# Patient Record
Sex: Male | Born: 1940
Health system: Southern US, Community
[De-identification: ages and names within clinical notes are randomized; demographics above are authoritative.]

## PROBLEM LIST (undated history)

## (undated) DIAGNOSIS — I10 Essential (primary) hypertension: Secondary | ICD-10-CM

---

## 2012-01-05 DIAGNOSIS — R609 Edema, unspecified: Secondary | ICD-10-CM | POA: Diagnosis not present

## 2012-01-05 DIAGNOSIS — K219 Gastro-esophageal reflux disease without esophagitis: Secondary | ICD-10-CM | POA: Diagnosis not present

## 2012-01-05 DIAGNOSIS — K5900 Constipation, unspecified: Secondary | ICD-10-CM | POA: Diagnosis not present

## 2012-01-05 DIAGNOSIS — J301 Allergic rhinitis due to pollen: Secondary | ICD-10-CM | POA: Diagnosis not present

## 2012-01-06 DIAGNOSIS — M722 Plantar fascial fibromatosis: Secondary | ICD-10-CM | POA: Diagnosis not present

## 2012-01-06 DIAGNOSIS — M25579 Pain in unspecified ankle and joints of unspecified foot: Secondary | ICD-10-CM | POA: Diagnosis not present

## 2012-04-05 DIAGNOSIS — R55 Syncope and collapse: Secondary | ICD-10-CM | POA: Diagnosis not present

## 2012-04-05 DIAGNOSIS — E782 Mixed hyperlipidemia: Secondary | ICD-10-CM | POA: Diagnosis not present

## 2012-05-11 DIAGNOSIS — I10 Essential (primary) hypertension: Secondary | ICD-10-CM | POA: Diagnosis not present

## 2012-05-11 DIAGNOSIS — Z87891 Personal history of nicotine dependence: Secondary | ICD-10-CM | POA: Diagnosis not present

## 2012-05-11 DIAGNOSIS — R221 Localized swelling, mass and lump, neck: Secondary | ICD-10-CM | POA: Diagnosis not present

## 2012-05-11 DIAGNOSIS — R5383 Other fatigue: Secondary | ICD-10-CM | POA: Diagnosis not present

## 2012-05-11 DIAGNOSIS — K219 Gastro-esophageal reflux disease without esophagitis: Secondary | ICD-10-CM | POA: Diagnosis not present

## 2012-05-11 DIAGNOSIS — K449 Diaphragmatic hernia without obstruction or gangrene: Secondary | ICD-10-CM | POA: Diagnosis not present

## 2012-05-11 DIAGNOSIS — R0789 Other chest pain: Secondary | ICD-10-CM | POA: Diagnosis not present

## 2012-05-11 DIAGNOSIS — R220 Localized swelling, mass and lump, head: Secondary | ICD-10-CM | POA: Diagnosis not present

## 2012-05-11 DIAGNOSIS — R079 Chest pain, unspecified: Secondary | ICD-10-CM | POA: Diagnosis not present

## 2012-05-11 DIAGNOSIS — R0602 Shortness of breath: Secondary | ICD-10-CM | POA: Diagnosis not present

## 2012-05-11 DIAGNOSIS — J449 Chronic obstructive pulmonary disease, unspecified: Secondary | ICD-10-CM | POA: Diagnosis not present

## 2012-05-11 DIAGNOSIS — F329 Major depressive disorder, single episode, unspecified: Secondary | ICD-10-CM | POA: Diagnosis not present

## 2012-05-11 DIAGNOSIS — E785 Hyperlipidemia, unspecified: Secondary | ICD-10-CM | POA: Diagnosis not present

## 2012-05-11 DIAGNOSIS — E871 Hypo-osmolality and hyponatremia: Secondary | ICD-10-CM | POA: Diagnosis not present

## 2012-05-11 DIAGNOSIS — Z79899 Other long term (current) drug therapy: Secondary | ICD-10-CM | POA: Diagnosis not present

## 2012-05-11 DIAGNOSIS — Z8249 Family history of ischemic heart disease and other diseases of the circulatory system: Secondary | ICD-10-CM | POA: Diagnosis not present

## 2012-05-11 DIAGNOSIS — R42 Dizziness and giddiness: Secondary | ICD-10-CM | POA: Diagnosis not present

## 2012-05-12 DIAGNOSIS — I10 Essential (primary) hypertension: Secondary | ICD-10-CM | POA: Diagnosis not present

## 2012-05-12 DIAGNOSIS — E871 Hypo-osmolality and hyponatremia: Secondary | ICD-10-CM | POA: Diagnosis not present

## 2012-05-12 DIAGNOSIS — M47812 Spondylosis without myelopathy or radiculopathy, cervical region: Secondary | ICD-10-CM | POA: Diagnosis not present

## 2012-05-12 DIAGNOSIS — F329 Major depressive disorder, single episode, unspecified: Secondary | ICD-10-CM | POA: Diagnosis not present

## 2012-05-12 DIAGNOSIS — R079 Chest pain, unspecified: Secondary | ICD-10-CM | POA: Diagnosis not present

## 2012-05-12 DIAGNOSIS — R220 Localized swelling, mass and lump, head: Secondary | ICD-10-CM | POA: Diagnosis not present

## 2012-05-13 DIAGNOSIS — I10 Essential (primary) hypertension: Secondary | ICD-10-CM | POA: Diagnosis not present

## 2012-05-13 DIAGNOSIS — E871 Hypo-osmolality and hyponatremia: Secondary | ICD-10-CM | POA: Diagnosis not present

## 2012-05-13 DIAGNOSIS — F329 Major depressive disorder, single episode, unspecified: Secondary | ICD-10-CM | POA: Diagnosis not present

## 2012-05-13 DIAGNOSIS — R079 Chest pain, unspecified: Secondary | ICD-10-CM | POA: Diagnosis not present

## 2012-05-13 DIAGNOSIS — R221 Localized swelling, mass and lump, neck: Secondary | ICD-10-CM | POA: Diagnosis not present

## 2012-05-27 DIAGNOSIS — I209 Angina pectoris, unspecified: Secondary | ICD-10-CM | POA: Diagnosis not present

## 2012-07-05 DIAGNOSIS — J449 Chronic obstructive pulmonary disease, unspecified: Secondary | ICD-10-CM | POA: Diagnosis not present

## 2012-09-09 DIAGNOSIS — H2510 Age-related nuclear cataract, unspecified eye: Secondary | ICD-10-CM | POA: Diagnosis not present

## 2012-10-08 DIAGNOSIS — J209 Acute bronchitis, unspecified: Secondary | ICD-10-CM | POA: Diagnosis not present

## 2012-11-09 DIAGNOSIS — H25049 Posterior subcapsular polar age-related cataract, unspecified eye: Secondary | ICD-10-CM | POA: Diagnosis not present

## 2012-11-09 DIAGNOSIS — D3130 Benign neoplasm of unspecified choroid: Secondary | ICD-10-CM | POA: Diagnosis not present

## 2012-11-09 DIAGNOSIS — H2510 Age-related nuclear cataract, unspecified eye: Secondary | ICD-10-CM | POA: Diagnosis not present

## 2012-11-19 DIAGNOSIS — H2510 Age-related nuclear cataract, unspecified eye: Secondary | ICD-10-CM | POA: Diagnosis not present

## 2012-11-22 DIAGNOSIS — H2510 Age-related nuclear cataract, unspecified eye: Secondary | ICD-10-CM | POA: Diagnosis not present

## 2012-11-22 DIAGNOSIS — D3130 Benign neoplasm of unspecified choroid: Secondary | ICD-10-CM | POA: Diagnosis not present

## 2012-11-22 DIAGNOSIS — H25049 Posterior subcapsular polar age-related cataract, unspecified eye: Secondary | ICD-10-CM | POA: Diagnosis not present

## 2012-12-06 DIAGNOSIS — H2510 Age-related nuclear cataract, unspecified eye: Secondary | ICD-10-CM | POA: Diagnosis not present

## 2012-12-06 DIAGNOSIS — H25049 Posterior subcapsular polar age-related cataract, unspecified eye: Secondary | ICD-10-CM | POA: Diagnosis not present

## 2013-01-10 DIAGNOSIS — J449 Chronic obstructive pulmonary disease, unspecified: Secondary | ICD-10-CM | POA: Diagnosis not present

## 2013-04-11 DIAGNOSIS — Z0000 Encounter for general adult medical examination without abnormal findings: Secondary | ICD-10-CM | POA: Diagnosis not present

## 2013-04-11 DIAGNOSIS — R55 Syncope and collapse: Secondary | ICD-10-CM | POA: Diagnosis not present

## 2013-04-11 DIAGNOSIS — G3184 Mild cognitive impairment, so stated: Secondary | ICD-10-CM | POA: Diagnosis not present

## 2013-04-11 DIAGNOSIS — E782 Mixed hyperlipidemia: Secondary | ICD-10-CM | POA: Diagnosis not present

## 2013-04-11 DIAGNOSIS — Z125 Encounter for screening for malignant neoplasm of prostate: Secondary | ICD-10-CM | POA: Diagnosis not present

## 2013-04-11 DIAGNOSIS — E559 Vitamin D deficiency, unspecified: Secondary | ICD-10-CM | POA: Diagnosis not present

## 2013-05-05 DIAGNOSIS — R1310 Dysphagia, unspecified: Secondary | ICD-10-CM | POA: Diagnosis not present

## 2013-05-05 DIAGNOSIS — Z86010 Personal history of colonic polyps: Secondary | ICD-10-CM | POA: Diagnosis not present

## 2013-05-09 DIAGNOSIS — Z79899 Other long term (current) drug therapy: Secondary | ICD-10-CM | POA: Diagnosis not present

## 2013-05-09 DIAGNOSIS — Z1211 Encounter for screening for malignant neoplasm of colon: Secondary | ICD-10-CM | POA: Diagnosis not present

## 2013-05-09 DIAGNOSIS — J449 Chronic obstructive pulmonary disease, unspecified: Secondary | ICD-10-CM | POA: Diagnosis not present

## 2013-05-09 DIAGNOSIS — E785 Hyperlipidemia, unspecified: Secondary | ICD-10-CM | POA: Diagnosis not present

## 2013-05-09 DIAGNOSIS — Z818 Family history of other mental and behavioral disorders: Secondary | ICD-10-CM | POA: Diagnosis not present

## 2013-05-09 DIAGNOSIS — K5730 Diverticulosis of large intestine without perforation or abscess without bleeding: Secondary | ICD-10-CM | POA: Diagnosis not present

## 2013-05-09 DIAGNOSIS — K219 Gastro-esophageal reflux disease without esophagitis: Secondary | ICD-10-CM | POA: Diagnosis not present

## 2013-05-09 DIAGNOSIS — Z8701 Personal history of pneumonia (recurrent): Secondary | ICD-10-CM | POA: Diagnosis not present

## 2013-05-09 DIAGNOSIS — D131 Benign neoplasm of stomach: Secondary | ICD-10-CM | POA: Diagnosis not present

## 2013-05-09 DIAGNOSIS — Z860100 Personal history of colon polyps, unspecified: Secondary | ICD-10-CM | POA: Diagnosis not present

## 2013-05-09 DIAGNOSIS — D126 Benign neoplasm of colon, unspecified: Secondary | ICD-10-CM | POA: Diagnosis not present

## 2013-05-09 DIAGNOSIS — K222 Esophageal obstruction: Secondary | ICD-10-CM | POA: Diagnosis not present

## 2013-05-09 DIAGNOSIS — K5900 Constipation, unspecified: Secondary | ICD-10-CM | POA: Diagnosis not present

## 2013-05-09 DIAGNOSIS — Z87891 Personal history of nicotine dependence: Secondary | ICD-10-CM | POA: Diagnosis not present

## 2013-05-09 DIAGNOSIS — Z7982 Long term (current) use of aspirin: Secondary | ICD-10-CM | POA: Diagnosis not present

## 2013-05-09 DIAGNOSIS — R1310 Dysphagia, unspecified: Secondary | ICD-10-CM | POA: Diagnosis not present

## 2013-05-20 DIAGNOSIS — D126 Benign neoplasm of colon, unspecified: Secondary | ICD-10-CM | POA: Diagnosis not present

## 2013-05-20 DIAGNOSIS — R1310 Dysphagia, unspecified: Secondary | ICD-10-CM | POA: Diagnosis not present

## 2013-06-13 DIAGNOSIS — M899 Disorder of bone, unspecified: Secondary | ICD-10-CM | POA: Diagnosis not present

## 2013-06-13 DIAGNOSIS — J449 Chronic obstructive pulmonary disease, unspecified: Secondary | ICD-10-CM | POA: Diagnosis not present

## 2013-06-13 DIAGNOSIS — M949 Disorder of cartilage, unspecified: Secondary | ICD-10-CM | POA: Diagnosis not present

## 2013-06-13 DIAGNOSIS — Z87891 Personal history of nicotine dependence: Secondary | ICD-10-CM | POA: Diagnosis not present

## 2013-07-11 DIAGNOSIS — J449 Chronic obstructive pulmonary disease, unspecified: Secondary | ICD-10-CM | POA: Diagnosis not present

## 2013-08-01 DIAGNOSIS — R42 Dizziness and giddiness: Secondary | ICD-10-CM | POA: Diagnosis not present

## 2013-08-01 DIAGNOSIS — I69998 Other sequelae following unspecified cerebrovascular disease: Secondary | ICD-10-CM | POA: Diagnosis not present

## 2013-10-06 DIAGNOSIS — R0602 Shortness of breath: Secondary | ICD-10-CM | POA: Diagnosis not present

## 2013-10-06 DIAGNOSIS — Z825 Family history of asthma and other chronic lower respiratory diseases: Secondary | ICD-10-CM | POA: Diagnosis not present

## 2013-10-06 DIAGNOSIS — Z79899 Other long term (current) drug therapy: Secondary | ICD-10-CM | POA: Diagnosis not present

## 2013-10-06 DIAGNOSIS — J449 Chronic obstructive pulmonary disease, unspecified: Secondary | ICD-10-CM | POA: Diagnosis not present

## 2013-10-06 DIAGNOSIS — Z87891 Personal history of nicotine dependence: Secondary | ICD-10-CM | POA: Diagnosis not present

## 2013-10-06 DIAGNOSIS — R05 Cough: Secondary | ICD-10-CM | POA: Diagnosis not present

## 2013-10-06 DIAGNOSIS — Z836 Family history of other diseases of the respiratory system: Secondary | ICD-10-CM | POA: Diagnosis not present

## 2013-10-07 DIAGNOSIS — R0602 Shortness of breath: Secondary | ICD-10-CM | POA: Diagnosis not present

## 2013-10-11 DIAGNOSIS — Z2821 Immunization not carried out because of patient refusal: Secondary | ICD-10-CM | POA: Diagnosis not present

## 2013-10-11 DIAGNOSIS — Z79899 Other long term (current) drug therapy: Secondary | ICD-10-CM | POA: Diagnosis not present

## 2013-10-11 DIAGNOSIS — R55 Syncope and collapse: Secondary | ICD-10-CM | POA: Diagnosis not present

## 2013-10-11 DIAGNOSIS — I10 Essential (primary) hypertension: Secondary | ICD-10-CM | POA: Diagnosis not present

## 2013-10-11 DIAGNOSIS — I498 Other specified cardiac arrhythmias: Secondary | ICD-10-CM | POA: Diagnosis not present

## 2013-10-11 DIAGNOSIS — K219 Gastro-esophageal reflux disease without esophagitis: Secondary | ICD-10-CM | POA: Diagnosis not present

## 2013-10-11 DIAGNOSIS — Z87891 Personal history of nicotine dependence: Secondary | ICD-10-CM | POA: Diagnosis not present

## 2013-10-11 DIAGNOSIS — F329 Major depressive disorder, single episode, unspecified: Secondary | ICD-10-CM | POA: Diagnosis not present

## 2013-10-11 DIAGNOSIS — Z8249 Family history of ischemic heart disease and other diseases of the circulatory system: Secondary | ICD-10-CM | POA: Diagnosis not present

## 2013-10-11 DIAGNOSIS — Z836 Family history of other diseases of the respiratory system: Secondary | ICD-10-CM | POA: Diagnosis not present

## 2013-10-11 DIAGNOSIS — Z9861 Coronary angioplasty status: Secondary | ICD-10-CM | POA: Diagnosis not present

## 2013-10-11 DIAGNOSIS — E785 Hyperlipidemia, unspecified: Secondary | ICD-10-CM | POA: Diagnosis not present

## 2013-10-11 DIAGNOSIS — R42 Dizziness and giddiness: Secondary | ICD-10-CM | POA: Diagnosis not present

## 2013-10-11 DIAGNOSIS — I2510 Atherosclerotic heart disease of native coronary artery without angina pectoris: Secondary | ICD-10-CM | POA: Diagnosis not present

## 2013-10-11 DIAGNOSIS — I359 Nonrheumatic aortic valve disorder, unspecified: Secondary | ICD-10-CM | POA: Diagnosis not present

## 2013-10-12 DIAGNOSIS — I519 Heart disease, unspecified: Secondary | ICD-10-CM | POA: Diagnosis not present

## 2013-10-12 DIAGNOSIS — I70209 Unspecified atherosclerosis of native arteries of extremities, unspecified extremity: Secondary | ICD-10-CM | POA: Diagnosis not present

## 2013-10-12 DIAGNOSIS — I10 Essential (primary) hypertension: Secondary | ICD-10-CM | POA: Diagnosis not present

## 2013-10-12 DIAGNOSIS — I498 Other specified cardiac arrhythmias: Secondary | ICD-10-CM | POA: Diagnosis not present

## 2013-10-12 DIAGNOSIS — R55 Syncope and collapse: Secondary | ICD-10-CM | POA: Diagnosis not present

## 2013-10-12 DIAGNOSIS — R42 Dizziness and giddiness: Secondary | ICD-10-CM | POA: Diagnosis not present

## 2013-10-12 DIAGNOSIS — F329 Major depressive disorder, single episode, unspecified: Secondary | ICD-10-CM | POA: Diagnosis not present

## 2013-10-12 DIAGNOSIS — I359 Nonrheumatic aortic valve disorder, unspecified: Secondary | ICD-10-CM | POA: Diagnosis not present

## 2013-10-13 DIAGNOSIS — J4489 Other specified chronic obstructive pulmonary disease: Secondary | ICD-10-CM | POA: Diagnosis not present

## 2013-10-13 DIAGNOSIS — R42 Dizziness and giddiness: Secondary | ICD-10-CM | POA: Diagnosis not present

## 2013-10-13 DIAGNOSIS — F329 Major depressive disorder, single episode, unspecified: Secondary | ICD-10-CM | POA: Diagnosis not present

## 2013-10-13 DIAGNOSIS — F3289 Other specified depressive episodes: Secondary | ICD-10-CM | POA: Diagnosis not present

## 2013-10-13 DIAGNOSIS — R55 Syncope and collapse: Secondary | ICD-10-CM | POA: Diagnosis not present

## 2013-10-13 DIAGNOSIS — I359 Nonrheumatic aortic valve disorder, unspecified: Secondary | ICD-10-CM | POA: Diagnosis not present

## 2013-10-13 DIAGNOSIS — I498 Other specified cardiac arrhythmias: Secondary | ICD-10-CM | POA: Diagnosis not present

## 2013-10-13 DIAGNOSIS — I10 Essential (primary) hypertension: Secondary | ICD-10-CM | POA: Diagnosis not present

## 2013-10-14 DIAGNOSIS — I359 Nonrheumatic aortic valve disorder, unspecified: Secondary | ICD-10-CM | POA: Diagnosis not present

## 2013-10-14 DIAGNOSIS — I10 Essential (primary) hypertension: Secondary | ICD-10-CM | POA: Diagnosis not present

## 2013-10-14 DIAGNOSIS — I498 Other specified cardiac arrhythmias: Secondary | ICD-10-CM | POA: Diagnosis not present

## 2013-10-14 DIAGNOSIS — R55 Syncope and collapse: Secondary | ICD-10-CM | POA: Diagnosis not present

## 2013-10-14 DIAGNOSIS — R42 Dizziness and giddiness: Secondary | ICD-10-CM | POA: Diagnosis not present

## 2013-10-14 DIAGNOSIS — F329 Major depressive disorder, single episode, unspecified: Secondary | ICD-10-CM | POA: Diagnosis not present

## 2013-11-08 DIAGNOSIS — G3184 Mild cognitive impairment, so stated: Secondary | ICD-10-CM | POA: Diagnosis not present

## 2014-02-07 DIAGNOSIS — J449 Chronic obstructive pulmonary disease, unspecified: Secondary | ICD-10-CM | POA: Diagnosis not present

## 2014-02-07 DIAGNOSIS — Z79899 Other long term (current) drug therapy: Secondary | ICD-10-CM | POA: Diagnosis not present

## 2014-02-07 DIAGNOSIS — R319 Hematuria, unspecified: Secondary | ICD-10-CM | POA: Diagnosis not present

## 2014-02-07 DIAGNOSIS — Z136 Encounter for screening for cardiovascular disorders: Secondary | ICD-10-CM | POA: Diagnosis not present

## 2014-04-10 DIAGNOSIS — Z961 Presence of intraocular lens: Secondary | ICD-10-CM | POA: Diagnosis not present

## 2014-04-10 DIAGNOSIS — H43819 Vitreous degeneration, unspecified eye: Secondary | ICD-10-CM | POA: Diagnosis not present

## 2014-04-24 DIAGNOSIS — H43399 Other vitreous opacities, unspecified eye: Secondary | ICD-10-CM | POA: Diagnosis not present

## 2014-05-10 DIAGNOSIS — J449 Chronic obstructive pulmonary disease, unspecified: Secondary | ICD-10-CM | POA: Diagnosis not present

## 2014-05-24 DIAGNOSIS — H43819 Vitreous degeneration, unspecified eye: Secondary | ICD-10-CM | POA: Diagnosis not present

## 2014-08-17 DIAGNOSIS — J449 Chronic obstructive pulmonary disease, unspecified: Secondary | ICD-10-CM | POA: Diagnosis not present

## 2014-08-25 DIAGNOSIS — J449 Chronic obstructive pulmonary disease, unspecified: Secondary | ICD-10-CM | POA: Diagnosis not present

## 2014-11-21 DIAGNOSIS — Z136 Encounter for screening for cardiovascular disorders: Secondary | ICD-10-CM | POA: Diagnosis not present

## 2014-11-21 DIAGNOSIS — J441 Chronic obstructive pulmonary disease with (acute) exacerbation: Secondary | ICD-10-CM | POA: Diagnosis not present

## 2015-02-23 DIAGNOSIS — Z0000 Encounter for general adult medical examination without abnormal findings: Secondary | ICD-10-CM | POA: Diagnosis not present

## 2015-02-23 DIAGNOSIS — Z136 Encounter for screening for cardiovascular disorders: Secondary | ICD-10-CM | POA: Diagnosis not present

## 2015-02-23 DIAGNOSIS — J441 Chronic obstructive pulmonary disease with (acute) exacerbation: Secondary | ICD-10-CM | POA: Diagnosis not present

## 2015-02-23 DIAGNOSIS — Z125 Encounter for screening for malignant neoplasm of prostate: Secondary | ICD-10-CM | POA: Diagnosis not present

## 2015-02-23 DIAGNOSIS — Z23 Encounter for immunization: Secondary | ICD-10-CM | POA: Diagnosis not present

## 2015-02-23 DIAGNOSIS — Z131 Encounter for screening for diabetes mellitus: Secondary | ICD-10-CM | POA: Diagnosis not present

## 2015-02-23 DIAGNOSIS — Z1389 Encounter for screening for other disorder: Secondary | ICD-10-CM | POA: Diagnosis not present

## 2015-05-16 DIAGNOSIS — F0390 Unspecified dementia without behavioral disturbance: Secondary | ICD-10-CM | POA: Diagnosis not present

## 2015-05-16 DIAGNOSIS — J441 Chronic obstructive pulmonary disease with (acute) exacerbation: Secondary | ICD-10-CM | POA: Diagnosis not present

## 2015-05-16 DIAGNOSIS — J309 Allergic rhinitis, unspecified: Secondary | ICD-10-CM | POA: Diagnosis not present

## 2015-05-16 DIAGNOSIS — I2510 Atherosclerotic heart disease of native coronary artery without angina pectoris: Secondary | ICD-10-CM | POA: Diagnosis not present

## 2015-07-22 DIAGNOSIS — Z955 Presence of coronary angioplasty implant and graft: Secondary | ICD-10-CM | POA: Diagnosis not present

## 2015-07-22 DIAGNOSIS — K219 Gastro-esophageal reflux disease without esophagitis: Secondary | ICD-10-CM | POA: Diagnosis not present

## 2015-07-22 DIAGNOSIS — I2510 Atherosclerotic heart disease of native coronary artery without angina pectoris: Secondary | ICD-10-CM | POA: Diagnosis not present

## 2015-07-22 DIAGNOSIS — J45909 Unspecified asthma, uncomplicated: Secondary | ICD-10-CM | POA: Diagnosis not present

## 2015-07-22 DIAGNOSIS — Z7951 Long term (current) use of inhaled steroids: Secondary | ICD-10-CM | POA: Diagnosis not present

## 2015-07-22 DIAGNOSIS — Z7982 Long term (current) use of aspirin: Secondary | ICD-10-CM | POA: Diagnosis not present

## 2015-07-22 DIAGNOSIS — Z79899 Other long term (current) drug therapy: Secondary | ICD-10-CM | POA: Diagnosis not present

## 2015-07-22 DIAGNOSIS — I10 Essential (primary) hypertension: Secondary | ICD-10-CM | POA: Diagnosis not present

## 2015-07-22 DIAGNOSIS — R079 Chest pain, unspecified: Secondary | ICD-10-CM | POA: Diagnosis not present

## 2015-07-22 DIAGNOSIS — R42 Dizziness and giddiness: Secondary | ICD-10-CM | POA: Diagnosis not present

## 2015-07-22 DIAGNOSIS — F329 Major depressive disorder, single episode, unspecified: Secondary | ICD-10-CM | POA: Diagnosis not present

## 2015-07-22 DIAGNOSIS — R0602 Shortness of breath: Secondary | ICD-10-CM | POA: Diagnosis not present

## 2015-07-23 DIAGNOSIS — I10 Essential (primary) hypertension: Secondary | ICD-10-CM | POA: Diagnosis not present

## 2015-07-23 DIAGNOSIS — R079 Chest pain, unspecified: Secondary | ICD-10-CM | POA: Diagnosis not present

## 2015-07-23 DIAGNOSIS — I2510 Atherosclerotic heart disease of native coronary artery without angina pectoris: Secondary | ICD-10-CM | POA: Diagnosis not present

## 2015-07-23 DIAGNOSIS — F329 Major depressive disorder, single episode, unspecified: Secondary | ICD-10-CM | POA: Diagnosis not present

## 2015-07-23 DIAGNOSIS — Z955 Presence of coronary angioplasty implant and graft: Secondary | ICD-10-CM | POA: Diagnosis not present

## 2015-07-26 DIAGNOSIS — R079 Chest pain, unspecified: Secondary | ICD-10-CM | POA: Diagnosis not present

## 2015-07-30 DIAGNOSIS — I2510 Atherosclerotic heart disease of native coronary artery without angina pectoris: Secondary | ICD-10-CM | POA: Diagnosis not present

## 2015-07-30 DIAGNOSIS — E559 Vitamin D deficiency, unspecified: Secondary | ICD-10-CM | POA: Diagnosis not present

## 2015-07-30 DIAGNOSIS — F0390 Unspecified dementia without behavioral disturbance: Secondary | ICD-10-CM | POA: Diagnosis not present

## 2015-10-30 DIAGNOSIS — I2510 Atherosclerotic heart disease of native coronary artery without angina pectoris: Secondary | ICD-10-CM | POA: Diagnosis not present

## 2015-10-30 DIAGNOSIS — J440 Chronic obstructive pulmonary disease with acute lower respiratory infection: Secondary | ICD-10-CM | POA: Diagnosis not present

## 2015-10-30 DIAGNOSIS — F0280 Dementia in other diseases classified elsewhere without behavioral disturbance: Secondary | ICD-10-CM | POA: Diagnosis not present

## 2016-03-04 DIAGNOSIS — J440 Chronic obstructive pulmonary disease with acute lower respiratory infection: Secondary | ICD-10-CM | POA: Diagnosis not present

## 2016-03-04 DIAGNOSIS — K210 Gastro-esophageal reflux disease with esophagitis: Secondary | ICD-10-CM | POA: Diagnosis not present

## 2016-03-04 DIAGNOSIS — F0280 Dementia in other diseases classified elsewhere without behavioral disturbance: Secondary | ICD-10-CM | POA: Diagnosis not present

## 2016-04-24 DIAGNOSIS — K210 Gastro-esophageal reflux disease with esophagitis: Secondary | ICD-10-CM | POA: Diagnosis not present

## 2016-04-24 DIAGNOSIS — R5383 Other fatigue: Secondary | ICD-10-CM | POA: Diagnosis not present

## 2016-04-24 DIAGNOSIS — F0280 Dementia in other diseases classified elsewhere without behavioral disturbance: Secondary | ICD-10-CM | POA: Diagnosis not present

## 2016-04-28 DIAGNOSIS — H8113 Benign paroxysmal vertigo, bilateral: Secondary | ICD-10-CM | POA: Diagnosis not present

## 2016-04-28 DIAGNOSIS — H918X3 Other specified hearing loss, bilateral: Secondary | ICD-10-CM | POA: Diagnosis not present

## 2016-04-28 DIAGNOSIS — H9190 Unspecified hearing loss, unspecified ear: Secondary | ICD-10-CM | POA: Diagnosis not present

## 2016-04-28 DIAGNOSIS — H8110 Benign paroxysmal vertigo, unspecified ear: Secondary | ICD-10-CM | POA: Diagnosis not present

## 2016-04-28 DIAGNOSIS — K219 Gastro-esophageal reflux disease without esophagitis: Secondary | ICD-10-CM | POA: Diagnosis not present

## 2016-04-28 DIAGNOSIS — Z9181 History of falling: Secondary | ICD-10-CM | POA: Diagnosis not present

## 2016-04-28 DIAGNOSIS — F0280 Dementia in other diseases classified elsewhere without behavioral disturbance: Secondary | ICD-10-CM | POA: Diagnosis not present

## 2016-04-30 DIAGNOSIS — H9190 Unspecified hearing loss, unspecified ear: Secondary | ICD-10-CM | POA: Diagnosis not present

## 2016-04-30 DIAGNOSIS — H8110 Benign paroxysmal vertigo, unspecified ear: Secondary | ICD-10-CM | POA: Diagnosis not present

## 2016-04-30 DIAGNOSIS — F0280 Dementia in other diseases classified elsewhere without behavioral disturbance: Secondary | ICD-10-CM | POA: Diagnosis not present

## 2016-04-30 DIAGNOSIS — K219 Gastro-esophageal reflux disease without esophagitis: Secondary | ICD-10-CM | POA: Diagnosis not present

## 2016-04-30 DIAGNOSIS — Z9181 History of falling: Secondary | ICD-10-CM | POA: Diagnosis not present

## 2016-05-01 DIAGNOSIS — Z9181 History of falling: Secondary | ICD-10-CM | POA: Diagnosis not present

## 2016-05-01 DIAGNOSIS — M1611 Unilateral primary osteoarthritis, right hip: Secondary | ICD-10-CM | POA: Diagnosis not present

## 2016-05-01 DIAGNOSIS — F0280 Dementia in other diseases classified elsewhere without behavioral disturbance: Secondary | ICD-10-CM | POA: Diagnosis not present

## 2016-05-01 DIAGNOSIS — K219 Gastro-esophageal reflux disease without esophagitis: Secondary | ICD-10-CM | POA: Diagnosis not present

## 2016-05-01 DIAGNOSIS — H8110 Benign paroxysmal vertigo, unspecified ear: Secondary | ICD-10-CM | POA: Diagnosis not present

## 2016-05-01 DIAGNOSIS — H9190 Unspecified hearing loss, unspecified ear: Secondary | ICD-10-CM | POA: Diagnosis not present

## 2016-05-01 DIAGNOSIS — M25551 Pain in right hip: Secondary | ICD-10-CM | POA: Diagnosis not present

## 2016-05-06 DIAGNOSIS — F0280 Dementia in other diseases classified elsewhere without behavioral disturbance: Secondary | ICD-10-CM | POA: Diagnosis not present

## 2016-05-06 DIAGNOSIS — Z9181 History of falling: Secondary | ICD-10-CM | POA: Diagnosis not present

## 2016-05-06 DIAGNOSIS — K219 Gastro-esophageal reflux disease without esophagitis: Secondary | ICD-10-CM | POA: Diagnosis not present

## 2016-05-06 DIAGNOSIS — H9190 Unspecified hearing loss, unspecified ear: Secondary | ICD-10-CM | POA: Diagnosis not present

## 2016-05-06 DIAGNOSIS — H8110 Benign paroxysmal vertigo, unspecified ear: Secondary | ICD-10-CM | POA: Diagnosis not present

## 2016-05-07 DIAGNOSIS — M5126 Other intervertebral disc displacement, lumbar region: Secondary | ICD-10-CM | POA: Diagnosis not present

## 2016-05-07 DIAGNOSIS — M47816 Spondylosis without myelopathy or radiculopathy, lumbar region: Secondary | ICD-10-CM | POA: Diagnosis not present

## 2016-05-08 DIAGNOSIS — H9190 Unspecified hearing loss, unspecified ear: Secondary | ICD-10-CM | POA: Diagnosis not present

## 2016-05-08 DIAGNOSIS — K219 Gastro-esophageal reflux disease without esophagitis: Secondary | ICD-10-CM | POA: Diagnosis not present

## 2016-05-08 DIAGNOSIS — Z9181 History of falling: Secondary | ICD-10-CM | POA: Diagnosis not present

## 2016-05-08 DIAGNOSIS — H8110 Benign paroxysmal vertigo, unspecified ear: Secondary | ICD-10-CM | POA: Diagnosis not present

## 2016-05-08 DIAGNOSIS — F0280 Dementia in other diseases classified elsewhere without behavioral disturbance: Secondary | ICD-10-CM | POA: Diagnosis not present

## 2016-05-13 DIAGNOSIS — H9190 Unspecified hearing loss, unspecified ear: Secondary | ICD-10-CM | POA: Diagnosis not present

## 2016-05-13 DIAGNOSIS — K219 Gastro-esophageal reflux disease without esophagitis: Secondary | ICD-10-CM | POA: Diagnosis not present

## 2016-05-13 DIAGNOSIS — Z9181 History of falling: Secondary | ICD-10-CM | POA: Diagnosis not present

## 2016-05-13 DIAGNOSIS — F0280 Dementia in other diseases classified elsewhere without behavioral disturbance: Secondary | ICD-10-CM | POA: Diagnosis not present

## 2016-05-13 DIAGNOSIS — H8110 Benign paroxysmal vertigo, unspecified ear: Secondary | ICD-10-CM | POA: Diagnosis not present

## 2016-05-14 DIAGNOSIS — K219 Gastro-esophageal reflux disease without esophagitis: Secondary | ICD-10-CM | POA: Diagnosis not present

## 2016-05-14 DIAGNOSIS — H8110 Benign paroxysmal vertigo, unspecified ear: Secondary | ICD-10-CM | POA: Diagnosis not present

## 2016-05-14 DIAGNOSIS — H9190 Unspecified hearing loss, unspecified ear: Secondary | ICD-10-CM | POA: Diagnosis not present

## 2016-05-14 DIAGNOSIS — F0280 Dementia in other diseases classified elsewhere without behavioral disturbance: Secondary | ICD-10-CM | POA: Diagnosis not present

## 2016-05-14 DIAGNOSIS — Z9181 History of falling: Secondary | ICD-10-CM | POA: Diagnosis not present

## 2016-05-16 DIAGNOSIS — K219 Gastro-esophageal reflux disease without esophagitis: Secondary | ICD-10-CM | POA: Diagnosis not present

## 2016-05-16 DIAGNOSIS — H8110 Benign paroxysmal vertigo, unspecified ear: Secondary | ICD-10-CM | POA: Diagnosis not present

## 2016-05-16 DIAGNOSIS — H9190 Unspecified hearing loss, unspecified ear: Secondary | ICD-10-CM | POA: Diagnosis not present

## 2016-05-16 DIAGNOSIS — Z9181 History of falling: Secondary | ICD-10-CM | POA: Diagnosis not present

## 2016-05-16 DIAGNOSIS — F0280 Dementia in other diseases classified elsewhere without behavioral disturbance: Secondary | ICD-10-CM | POA: Diagnosis not present

## 2016-05-19 DIAGNOSIS — K219 Gastro-esophageal reflux disease without esophagitis: Secondary | ICD-10-CM | POA: Diagnosis not present

## 2016-05-19 DIAGNOSIS — Z9181 History of falling: Secondary | ICD-10-CM | POA: Diagnosis not present

## 2016-05-19 DIAGNOSIS — H9190 Unspecified hearing loss, unspecified ear: Secondary | ICD-10-CM | POA: Diagnosis not present

## 2016-05-19 DIAGNOSIS — H8110 Benign paroxysmal vertigo, unspecified ear: Secondary | ICD-10-CM | POA: Diagnosis not present

## 2016-05-19 DIAGNOSIS — F0280 Dementia in other diseases classified elsewhere without behavioral disturbance: Secondary | ICD-10-CM | POA: Diagnosis not present

## 2016-05-22 DIAGNOSIS — F0280 Dementia in other diseases classified elsewhere without behavioral disturbance: Secondary | ICD-10-CM | POA: Diagnosis not present

## 2016-05-22 DIAGNOSIS — Z9181 History of falling: Secondary | ICD-10-CM | POA: Diagnosis not present

## 2016-05-22 DIAGNOSIS — K219 Gastro-esophageal reflux disease without esophagitis: Secondary | ICD-10-CM | POA: Diagnosis not present

## 2016-05-22 DIAGNOSIS — H8110 Benign paroxysmal vertigo, unspecified ear: Secondary | ICD-10-CM | POA: Diagnosis not present

## 2016-05-22 DIAGNOSIS — H9190 Unspecified hearing loss, unspecified ear: Secondary | ICD-10-CM | POA: Diagnosis not present

## 2016-05-27 DIAGNOSIS — H9190 Unspecified hearing loss, unspecified ear: Secondary | ICD-10-CM | POA: Diagnosis not present

## 2016-05-27 DIAGNOSIS — K210 Gastro-esophageal reflux disease with esophagitis: Secondary | ICD-10-CM | POA: Diagnosis not present

## 2016-05-27 DIAGNOSIS — H61892 Other specified disorders of left external ear: Secondary | ICD-10-CM | POA: Diagnosis not present

## 2016-05-27 DIAGNOSIS — Z9181 History of falling: Secondary | ICD-10-CM | POA: Diagnosis not present

## 2016-05-27 DIAGNOSIS — F0280 Dementia in other diseases classified elsewhere without behavioral disturbance: Secondary | ICD-10-CM | POA: Diagnosis not present

## 2016-05-27 DIAGNOSIS — H8110 Benign paroxysmal vertigo, unspecified ear: Secondary | ICD-10-CM | POA: Diagnosis not present

## 2016-05-27 DIAGNOSIS — J440 Chronic obstructive pulmonary disease with acute lower respiratory infection: Secondary | ICD-10-CM | POA: Diagnosis not present

## 2016-05-27 DIAGNOSIS — K219 Gastro-esophageal reflux disease without esophagitis: Secondary | ICD-10-CM | POA: Diagnosis not present

## 2016-08-11 DIAGNOSIS — H8113 Benign paroxysmal vertigo, bilateral: Secondary | ICD-10-CM | POA: Diagnosis not present

## 2016-08-11 DIAGNOSIS — Z13220 Encounter for screening for lipoid disorders: Secondary | ICD-10-CM | POA: Diagnosis not present

## 2016-08-11 DIAGNOSIS — Z0000 Encounter for general adult medical examination without abnormal findings: Secondary | ICD-10-CM | POA: Diagnosis not present

## 2016-08-11 DIAGNOSIS — Z131 Encounter for screening for diabetes mellitus: Secondary | ICD-10-CM | POA: Diagnosis not present

## 2016-08-11 DIAGNOSIS — J440 Chronic obstructive pulmonary disease with acute lower respiratory infection: Secondary | ICD-10-CM | POA: Diagnosis not present

## 2016-08-11 DIAGNOSIS — K210 Gastro-esophageal reflux disease with esophagitis: Secondary | ICD-10-CM | POA: Diagnosis not present

## 2016-08-11 DIAGNOSIS — Z1389 Encounter for screening for other disorder: Secondary | ICD-10-CM | POA: Diagnosis not present

## 2016-11-11 DIAGNOSIS — J440 Chronic obstructive pulmonary disease with acute lower respiratory infection: Secondary | ICD-10-CM | POA: Diagnosis not present

## 2016-11-11 DIAGNOSIS — I208 Other forms of angina pectoris: Secondary | ICD-10-CM | POA: Diagnosis not present

## 2016-11-11 DIAGNOSIS — K210 Gastro-esophageal reflux disease with esophagitis: Secondary | ICD-10-CM | POA: Diagnosis not present

## 2016-11-11 DIAGNOSIS — Z1329 Encounter for screening for other suspected endocrine disorder: Secondary | ICD-10-CM | POA: Diagnosis not present

## 2016-12-26 ENCOUNTER — Inpatient Hospital Stay (HOSPITAL_COMMUNITY)
Admission: EM | Admit: 2016-12-26 | Discharge: 2016-12-30 | DRG: 640 | Disposition: A | Discharge status: Home Health | Payer: Medicare Other | Attending: Internal Medicine | Admitting: Internal Medicine

## 2016-12-26 ENCOUNTER — Emergency Department (HOSPITAL_COMMUNITY): Payer: Medicare Other

## 2016-12-26 ENCOUNTER — Encounter (HOSPITAL_COMMUNITY): Payer: Self-pay | Admitting: Emergency Medicine

## 2016-12-26 DIAGNOSIS — G9340 Encephalopathy, unspecified: Secondary | ICD-10-CM | POA: Diagnosis present

## 2016-12-26 DIAGNOSIS — R410 Disorientation, unspecified: Secondary | ICD-10-CM

## 2016-12-26 DIAGNOSIS — F0390 Unspecified dementia without behavioral disturbance: Secondary | ICD-10-CM | POA: Diagnosis not present

## 2016-12-26 DIAGNOSIS — R42 Dizziness and giddiness: Secondary | ICD-10-CM | POA: Diagnosis not present

## 2016-12-26 DIAGNOSIS — I951 Orthostatic hypotension: Secondary | ICD-10-CM | POA: Diagnosis present

## 2016-12-26 DIAGNOSIS — E860 Dehydration: Secondary | ICD-10-CM | POA: Diagnosis not present

## 2016-12-26 DIAGNOSIS — R2681 Unsteadiness on feet: Secondary | ICD-10-CM

## 2016-12-26 DIAGNOSIS — Z79899 Other long term (current) drug therapy: Secondary | ICD-10-CM

## 2016-12-26 DIAGNOSIS — E871 Hypo-osmolality and hyponatremia: Secondary | ICD-10-CM | POA: Diagnosis present

## 2016-12-26 DIAGNOSIS — R262 Difficulty in walking, not elsewhere classified: Secondary | ICD-10-CM

## 2016-12-26 DIAGNOSIS — G9349 Other encephalopathy: Secondary | ICD-10-CM | POA: Diagnosis not present

## 2016-12-26 DIAGNOSIS — I350 Nonrheumatic aortic (valve) stenosis: Secondary | ICD-10-CM | POA: Diagnosis present

## 2016-12-26 DIAGNOSIS — R4701 Aphasia: Secondary | ICD-10-CM | POA: Diagnosis present

## 2016-12-26 DIAGNOSIS — R001 Bradycardia, unspecified: Secondary | ICD-10-CM | POA: Diagnosis present

## 2016-12-26 DIAGNOSIS — R471 Dysarthria and anarthria: Secondary | ICD-10-CM | POA: Diagnosis not present

## 2016-12-26 DIAGNOSIS — G309 Alzheimer's disease, unspecified: Secondary | ICD-10-CM | POA: Diagnosis not present

## 2016-12-26 DIAGNOSIS — F0280 Dementia in other diseases classified elsewhere without behavioral disturbance: Secondary | ICD-10-CM | POA: Diagnosis not present

## 2016-12-26 DIAGNOSIS — L219 Seborrheic dermatitis, unspecified: Secondary | ICD-10-CM | POA: Insufficient documentation

## 2016-12-26 DIAGNOSIS — Z7951 Long term (current) use of inhaled steroids: Secondary | ICD-10-CM

## 2016-12-26 DIAGNOSIS — I10 Essential (primary) hypertension: Secondary | ICD-10-CM | POA: Diagnosis present

## 2016-12-26 DIAGNOSIS — E785 Hyperlipidemia, unspecified: Secondary | ICD-10-CM | POA: Diagnosis present

## 2016-12-26 DIAGNOSIS — Z87891 Personal history of nicotine dependence: Secondary | ICD-10-CM

## 2016-12-26 HISTORY — DX: Essential (primary) hypertension: I10

## 2016-12-26 LAB — COMPREHENSIVE METABOLIC PANEL
ALBUMIN: 4.4 g/dL (ref 3.5–5.0)
ALK PHOS: 71 U/L (ref 38–126)
ALT: 14 U/L — AB (ref 17–63)
AST: 24 U/L (ref 15–41)
Anion gap: 6 (ref 5–15)
BILIRUBIN TOTAL: 1.3 mg/dL — AB (ref 0.3–1.2)
BUN: 23 mg/dL — AB (ref 6–20)
CALCIUM: 9.2 mg/dL (ref 8.9–10.3)
CO2: 27 mmol/L (ref 22–32)
Chloride: 101 mmol/L (ref 101–111)
Creatinine, Ser: 1.03 mg/dL (ref 0.61–1.24)
GFR calc Af Amer: >60 mL/min (ref >60)
GFR calc non Af Amer: >60 mL/min (ref >60)
GLUCOSE: 83 mg/dL (ref 65–99)
Potassium: 4 mmol/L (ref 3.5–5.1)
Sodium: 134 mmol/L — ABNORMAL LOW (ref 135–145)
TOTAL PROTEIN: 7.4 g/dL (ref 6.5–8.1)

## 2016-12-26 LAB — PROTIME-INR
INR: 0.99
Prothrombin Time: 13.1 seconds (ref 11.4–15.2)

## 2016-12-26 LAB — TROPONIN I

## 2016-12-26 LAB — CBC
HCT: 46.9 % (ref 39.0–52.0)
HEMOGLOBIN: 16 g/dL (ref 13.0–17.0)
MCH: 33.7 pg (ref 26.0–34.0)
MCHC: 34.1 g/dL (ref 30.0–36.0)
MCV: 98.7 fL (ref 78.0–100.0)
Platelets: 198 10*3/uL (ref 150–400)
RBC: 4.75 MIL/uL (ref 4.22–5.81)
RDW: 13.1 % (ref 11.5–15.5)
WBC: 7.1 10*3/uL (ref 4.0–10.5)

## 2016-12-26 LAB — DIFFERENTIAL
BASOS ABS: 0 10*3/uL (ref 0.0–0.1)
Basophils Relative: 0 %
Eosinophils Absolute: 0.1 10*3/uL (ref 0.0–0.7)
Eosinophils Relative: 2 %
LYMPHS ABS: 1.5 10*3/uL (ref 0.7–4.0)
LYMPHS PCT: 21 %
Monocytes Absolute: 0.9 10*3/uL (ref 0.1–1.0)
Monocytes Relative: 13 %
NEUTROS ABS: 4.6 10*3/uL (ref 1.7–7.7)
NEUTROS PCT: 64 %

## 2016-12-26 LAB — ETHANOL: Alcohol, Ethyl (B): <5 mg/dL (ref <5)

## 2016-12-26 LAB — URINALYSIS, ROUTINE W REFLEX MICROSCOPIC
BILIRUBIN URINE: NEGATIVE
Glucose, UA: NEGATIVE mg/dL
Hgb urine dipstick: NEGATIVE
Ketones, ur: 20 mg/dL — AB
Leukocytes, UA: NEGATIVE
NITRITE: NEGATIVE
PROTEIN: NEGATIVE mg/dL
SPECIFIC GRAVITY, URINE: 1.023 (ref 1.005–1.030)
pH: 5 (ref 5.0–8.0)

## 2016-12-26 LAB — APTT: APTT: 26 s (ref 24–36)

## 2016-12-26 LAB — TSH: TSH: 1.673 u[IU]/mL (ref 0.350–4.500)

## 2016-12-26 LAB — RAPID URINE DRUG SCREEN, HOSP PERFORMED
AMPHETAMINES: NOT DETECTED
BARBITURATES: NOT DETECTED
Benzodiazepines: NOT DETECTED
Cocaine: NOT DETECTED
Opiates: NOT DETECTED
Tetrahydrocannabinol: NOT DETECTED

## 2016-12-26 LAB — AMMONIA: Ammonia: 16 umol/L (ref 9–35)

## 2016-12-26 MED ORDER — MOMETASONE FURO-FORMOTEROL FUM 200-5 MCG/ACT IN AERO
2.0000 | INHALATION_SPRAY | Freq: Two times a day (BID) | RESPIRATORY_TRACT | Status: DC
  Administered 2016-12-26 – 2016-12-30 (×8): 2 via RESPIRATORY_TRACT
  Filled 2016-12-26: qty 8.8

## 2016-12-26 MED ORDER — MOMETASONE FURO-FORMOTEROL FUM 200-5 MCG/ACT IN AERO
INHALATION_SPRAY | RESPIRATORY_TRACT | Status: AC
  Filled 2016-12-26: qty 8.8

## 2016-12-26 MED ORDER — SENNOSIDES-DOCUSATE SODIUM 8.6-50 MG PO TABS
1.0000 | ORAL_TABLET | Freq: Every evening | ORAL | Status: DC | PRN
  Filled 2016-12-26: qty 1

## 2016-12-26 MED ORDER — ACETAMINOPHEN 650 MG RE SUPP: 650.0000 mg | RECTAL | Status: DC | PRN

## 2016-12-26 MED ORDER — ALBUTEROL SULFATE (2.5 MG/3ML) 0.083% IN NEBU: 3.0000 mL | INHALATION_SOLUTION | Freq: Four times a day (QID) | RESPIRATORY_TRACT | Status: DC | PRN

## 2016-12-26 MED ORDER — ACETAMINOPHEN 325 MG PO TABS: 650.0000 mg | ORAL_TABLET | ORAL | Status: DC | PRN

## 2016-12-26 MED ORDER — FLUTICASONE PROPIONATE 50 MCG/ACT NA SUSP
1.0000 | Freq: Every day | NASAL | Status: DC
Start: 2016-12-27 — End: 2016-12-30
  Administered 2016-12-27 – 2016-12-30 (×4): 1 via NASAL
  Filled 2016-12-26 (×2): qty 16

## 2016-12-26 MED ORDER — MEMANTINE HCL-DONEPEZIL HCL ER 28-10 MG PO CP24
1.0000 | ORAL_CAPSULE | Freq: Every day | ORAL | Status: DC
  Filled 2016-12-26 (×3): qty 1

## 2016-12-26 MED ORDER — KETOCONAZOLE 2 % EX CREA
TOPICAL_CREAM | Freq: Two times a day (BID) | CUTANEOUS | Status: DC
  Administered 2016-12-27 – 2016-12-30 (×7): via TOPICAL
  Filled 2016-12-26: qty 15

## 2016-12-26 MED ORDER — ACETAMINOPHEN 160 MG/5ML PO SOLN: 650.0000 mg | ORAL | Status: DC | PRN

## 2016-12-26 MED ORDER — SODIUM CHLORIDE 0.9 % IV SOLN
INTRAVENOUS | Status: DC
  Administered 2016-12-26: 21:00:00 via INTRAVENOUS

## 2016-12-26 MED ORDER — ENOXAPARIN SODIUM 40 MG/0.4ML ~~LOC~~ SOLN
40.0000 mg | SUBCUTANEOUS | Status: DC
  Administered 2016-12-26 – 2016-12-29 (×4): 40 mg via SUBCUTANEOUS
  Filled 2016-12-26 (×4): qty 0.4

## 2016-12-26 MED ORDER — ATORVASTATIN CALCIUM 10 MG PO TABS
10.0000 mg | ORAL_TABLET | Freq: Every day | ORAL | Status: DC
  Administered 2016-12-26 – 2016-12-29 (×4): 10 mg via ORAL
  Filled 2016-12-26 (×4): qty 1

## 2016-12-26 MED ORDER — MECLIZINE HCL 12.5 MG PO TABS: 25.0000 mg | ORAL_TABLET | Freq: Three times a day (TID) | ORAL | Status: DC | PRN

## 2016-12-26 MED ORDER — TRIAMCINOLONE ACETONIDE 0.025 % EX CREA
TOPICAL_CREAM | Freq: Two times a day (BID) | CUTANEOUS | Status: DC
  Administered 2016-12-27 – 2016-12-30 (×7): via TOPICAL
  Filled 2016-12-26: qty 15

## 2016-12-26 MED ORDER — ISOSORBIDE MONONITRATE ER 30 MG PO TB24
30.0000 mg | ORAL_TABLET | Freq: Every day | ORAL | Status: DC
  Administered 2016-12-27 – 2016-12-30 (×4): 30 mg via ORAL
  Filled 2016-12-26 (×4): qty 1

## 2016-12-26 NOTE — ED Notes (Signed)
Per report, Pt friends and caregivers are concerned that someone is financially exploiting pt as per report thousands of dollars of pt is missing

## 2016-12-26 NOTE — ED Notes (Signed)
Having confusion for one year gradually.  LKW was Tuesday of last week per friend.  Currently HR SB 45-47.  Pt is oriented to date, place, and name.  Had pain to right side of head yesterday about 10am or 11am.  Family friend says pt has had increasing confusion over last 6-8 months.  Gretchen Short (family friend)  [phone].

## 2016-12-26 NOTE — H&P (Signed)
History and Physical    David Richmond [id number] DOB: [birth year] DOA: 12/26/2016  PCP: Neale Burly, MD   Patient coming from: Home  Chief Complaint: Confusion, slurred speech  HPI: David Richmond is a 75 y.o. male with medical history significant for hypertension, hyperlipidemia, and dementia who presents to the emergency department for evaluation of confusion and slurred speech. Patient is brought in by a friend who is concerned that for the past 3 days, the patient has seemed quite confused and is slurring his words. Patient is said to have known dementia with at least 1 year of slowly progressive confusion, but there has been an acute change noted over the past 3 days where the patient is no longer able to hold a conversation, stopping midsentence, either unable to find the word he was looking for, or forgetting what he was talking about. There has been no known recent trauma or head injury and no recent change in his medications. Patient is said to be a non-drinker and is not known to use illicit substances. Patient denies any pain, and specifically denies headache, chest pain, or abdominal pain. Patient lives alone with a friend that checks on him periodically.   ED Course: Upon arrival to the ED, patient is found to be afebrile, saturating well on room air, bradycardic and then mid 40s, and with vitals otherwise stable. EKG features a sinus bradycardia with rate 52 and left axis deviation. Noncontrast head CT is negative for acute intracranial abnormality. Chemistry panel is notable for a mild hyponatremia and a mild elevation and BUN to creatinine ratio. Ethanol level is undetectable and UDS is completely negative. CBC is unremarkable, INR is within the normal limits, troponin is undetectable, and urinalysis is notable for 20 ketones only. Patient has remained dynamically stable in the ED and is not in any apparent respiratory distress. He will be observed on the telemetry unit for ongoing  evaluation and management of expressive aphasia with mild dysarthria, concerning for potential CVA/TIA.  Review of Systems:  Unable to obtain complete ROS secondary to the clinical scenario with expressive aphasia and dysarthria.  Past Medical History:  Diagnosis Date  . Hypertension     History reviewed. No pertinent surgical history.   reports that he has quit smoking. He has never used smokeless tobacco. He reports that he does not drink alcohol or use drugs.  No Known Allergies  Family History  Problem Relation Age of Onset  . Heart disease Other      Prior to Admission medications   Medication Sig Start Date End Date Taking? Authorizing Provider  albuterol (PROVENTIL HFA;VENTOLIN HFA) 108 (90 Base) MCG/ACT inhaler Inhale 1-2 puffs into the lungs every 6 (six) hours as needed for wheezing or shortness of breath.   Yes Historical Provider, MD  atorvastatin (LIPITOR) 10 MG tablet Take 10 mg by mouth at bedtime.    Yes Historical Provider, MD  budesonide-formoterol (SYMBICORT) 160-4.5 MCG/ACT inhaler Inhale 2 puffs into the lungs 2 (two) times daily.   Yes Historical Provider, MD  fluticasone (FLONASE) 50 MCG/ACT nasal spray Place 1 spray into both nostrils daily.   Yes Historical Provider, MD  isosorbide mononitrate (IMDUR) 30 MG 24 hr tablet Take 30 mg by mouth daily.   Yes Historical Provider, MD  meclizine (ANTIVERT) 25 MG tablet Take 25 mg by mouth 3 (three) times daily as needed for dizziness.   Yes Historical Provider, MD  Memantine HCl-Donepezil HCl (NAMZARIC) 28-10 MG CP24 Take 1 capsule by mouth daily.  Yes Historical Provider, MD    Physical Exam: Vitals:   12/26/16 1700 12/26/16 1830 12/26/16 1900 12/26/16 1930  BP: 105/76 (!) 160/50 (!) 148/53 153/63  Pulse: (!) 45 (!) 47 (!) 45 (!) 47  Resp: 14 15 21 12   Temp:      TempSrc:      SpO2: 100% 100% 96% 100%  Weight:      Height:          Constitutional: NAD, calm, comfortable Eyes: PERTLA, lids and  conjunctivae normal ENMT: Mucous membranes are moist. Posterior pharynx clear of any exudate or lesions.   Neck: normal, supple, no masses, no thyromegaly Respiratory: clear to auscultation bilaterally, no wheezing, no crackles. Normal respiratory effort.  Cardiovascular: Rate ~45 and regular with grade 3 holosystolic murmur at upper sternal border. Trace pretibial edema. No carotid bruits. No significant JVD. Abdomen: No distension, no tenderness, no masses palpated. Bowel sounds normal.  Musculoskeletal: no clubbing / cyanosis. No joint deformity upper and lower extremities. Normal muscle tone.  Skin: no significant rashes, lesions, ulcers. Seborrhea about the central face and forehead. Warm, dry, well-perfused. Neurologic: CN 2-12 grossly intact. Sensation intact, DTR normal. Strength 5/5 in all 4 limbs. Mild dysarthria, word-finding difficulty.  Psychiatric:  Alert and oriented to person and place only. Normal mood and affect.     Labs on Admission: I have personally reviewed following labs and imaging studies  CBC:  Recent Labs Lab 12/26/16 1318  WBC 7.1  NEUTROABS 4.6  HGB 16.0  HCT 46.9  MCV 98.7  PLT 99991111   Basic Metabolic Panel:  Recent Labs Lab 12/26/16 1318  NA 134*  K 4.0  CL 101  CO2 27  GLUCOSE 83  BUN 23*  CREATININE 1.03  CALCIUM 9.2   GFR: Estimated Creatinine Clearance: 59 mL/min (by C-G formula based on SCr of 1.03 mg/dL). Liver Function Tests:  Recent Labs Lab 12/26/16 1318  AST 24  ALT 14*  ALKPHOS 71  BILITOT 1.3*  PROT 7.4  ALBUMIN 4.4   No results for input(s): LIPASE, AMYLASE in the last 168 hours. No results for input(s): AMMONIA in the last 168 hours. Coagulation Profile:  Recent Labs Lab 12/26/16 1318  INR 0.99   Cardiac Enzymes:  Recent Labs Lab 12/26/16 1318  TROPONINI <0.03   BNP (last 3 results) No results for input(s): PROBNP in the last 8760 hours. HbA1C: No results for input(s): HGBA1C in the last 72  hours. CBG: No results for input(s): GLUCAP in the last 168 hours. Lipid Profile: No results for input(s): CHOL, HDL, LDLCALC, TRIG, CHOLHDL, LDLDIRECT in the last 72 hours. Thyroid Function Tests: No results for input(s): TSH, T4TOTAL, FREET4, T3FREE, THYROIDAB in the last 72 hours. Anemia Panel: No results for input(s): VITAMINB12, FOLATE, FERRITIN, TIBC, IRON, RETICCTPCT in the last 72 hours. Urine analysis:    Component Value Date/Time   COLORURINE YELLOW 12/26/2016 [place] 12/26/2016 1631   LABSPEC 1.023 12/26/2016 1631   PHURINE 5.0 12/26/2016 1631   GLUCOSEU NEGATIVE 12/26/2016 1631   HGBUR NEGATIVE 12/26/2016 1631   BILIRUBINUR NEGATIVE 12/26/2016 1631   KETONESUR 20 (A) 12/26/2016 1631   PROTEINUR NEGATIVE 12/26/2016 1631   NITRITE NEGATIVE 12/26/2016 1631   LEUKOCYTESUR NEGATIVE 12/26/2016 1631   Sepsis Labs: @LABRCNTIP (procalcitonin:4,lacticidven:4) )No results found for this or any previous visit (from the past 240 hour(s)).   Radiological Exams on Admission: Ct Head Wo Contrast  Result Date: 12/26/2016 CLINICAL DATA:  Worsening of dizziness, lightheadedness over  the last 2 days, some nausea EXAM: CT HEAD WITHOUT CONTRAST TECHNIQUE: Contiguous axial images were obtained from the base of the skull through the vertex without intravenous contrast. COMPARISON:  None. FINDINGS: Brain: The ventricular system is prominent as are the cortical sulci, consistent with diffuse atrophy. The septum is midline in position. The fourth ventricle and basilar cisterns are unremarkable. Moderate small vessel ischemic change is noted throughout the periventricular white matter. No hemorrhage, mass lesion, or acute infarction is seen. Vascular: No vascular abnormality is seen on this unenhanced study. Skull: No calvarial lesion is seen. Sinuses/Orbits: The paranasal sinuses are pneumatized. The maxillary sinuses are not visualized. Other: None IMPRESSION: Moderate small vessel  ischemic change. Atrophy. No acute intracranial abnormality. Electronically Signed   By: Ivar Drape M.D.   On: 12/26/2016 14:17    EKG: Independently reviewed. Sinus bradycardia (rate 52), LAD   Assessment/Plan  1. Expressive aphasia, dysarthria  - Pt reportedly has worsening confusion over past year, but now developed apparent expressive aphasia and mild dysarthria since 12/24/16  - Non-con head CT neg for acute findings; basic labs suggest mild dehydration, o/w unremarkable - There is no focal weakness or numbness identified and pt denies vision or hearing change - Plan to obtain MRI/MRA brain to exclude acute/subacute CVA as etiology; if positive, will add carotid imaging; TTE ordered as below  - No tPA as severity too mild and presents outside the appropriate timeframe  - Plan to keep NPO pending swallow eval, provide gentle IVF hydration and supportive care, f/u outstanding studies    2. Sinus bradycardia  - HR is 40s-50s with stable BP; sinus confirmed on EKG  - Pt is not on any nodal blocking agents  - Chronicity of this is unknown on admission d/t paucity of past medical records - No evidence for acute ischemia, possibly related to chronic ischemia  - Thyroid studies pending; monitor on telemetry, obtain TTE   3. Dementia  - Per report of patient's friend, progressively worsening over past year  - Continue memantine and donepezil   4. Hyponatremia - Serum sodium 134 on admission in setting of dehydration  - IVF hydration overnight with repeat chem panel in am    DVT prophylaxis: sq Lovenox  Code Status: Full  Family Communication: Discussed with patient Disposition Plan: Observe on telemetry Consults called: None Admission status: Observation    Vianne Bulls, MD [facility] Pager [phone]  If 7PM-7AM, please contact night-coverage [email or web address] Password [email or web address]  12/26/2016, 8:01 PM

## 2016-12-26 NOTE — ED Notes (Signed)
Pt presently confused.  Fall risk bracelet applied.

## 2016-12-26 NOTE — ED Triage Notes (Signed)
Friends brought to ED, pt called him to come check on him. States pt is confused,  Saying someone was in his house. friend states speech is slurred,

## 2016-12-26 NOTE — ED Provider Notes (Signed)
[facility] DEPT Provider Note   CSN: [id number] Arrival date & time: 12/26/16  1135   By signing my name below, I, Eunice Blase, attest that this documentation has been prepared under the direction and in the presence of Julianne Rice, MD. Electronically signed, Eunice Blase, ED Scribe. 12/26/16. 1:06 PM.   History   Chief Complaint Chief Complaint  Patient presents with  . Altered Mental Status   The history is provided by the patient and a friend. No language interpreter was used.    HPI Comments: Loghan Markel is a 75 y.o. male who presents to the Emergency Department complaining of progressively worsening dizziness and light headedness x 2 days. Pt reports nausea since waking that is worse when moving and has since subsided. Reveals progressive balance problems. He remarks that these symptoms are relieved when he sits still. Pt has had slurred speech beginning today per friend. Friend notes pt was suspicious of a possible stroke yesterday, and he reported right temporal area pain (first report of such). Friend notes pt's symptoms were much less severe yesterday than today. He denies vomiting or any pain at this time. PCP Dr. Freida Busman in [place].  History reviewed. No pertinent past medical history.  Patient Active Problem List   Diagnosis Date Noted  . Dementia 12/26/2016  . Acute encephalopathy 12/26/2016  . Sinus bradycardia 12/26/2016  . Orthostasis 12/26/2016    History reviewed. No pertinent surgical history.     Home Medications    Prior to Admission medications   Medication Sig Start Date End Date Taking? Authorizing Provider  albuterol (PROVENTIL HFA;VENTOLIN HFA) 108 (90 Base) MCG/ACT inhaler Inhale 1-2 puffs into the lungs every 6 (six) hours as needed for wheezing or shortness of breath.   Yes Historical Provider, MD  atorvastatin (LIPITOR) 10 MG tablet Take 10 mg by mouth at bedtime.    Yes Historical Provider, MD  budesonide-formoterol (SYMBICORT) 160-4.5  MCG/ACT inhaler Inhale 2 puffs into the lungs 2 (two) times daily.   Yes Historical Provider, MD  fluticasone (FLONASE) 50 MCG/ACT nasal spray Place 1 spray into both nostrils daily.   Yes Historical Provider, MD  isosorbide mononitrate (IMDUR) 30 MG 24 hr tablet Take 30 mg by mouth daily.   Yes Historical Provider, MD  meclizine (ANTIVERT) 25 MG tablet Take 25 mg by mouth 3 (three) times daily as needed for dizziness.   Yes Historical Provider, MD  Memantine HCl-Donepezil HCl (NAMZARIC) 28-10 MG CP24 Take 1 capsule by mouth daily.   Yes Historical Provider, MD    Family History History reviewed. No pertinent family history.  Social History Social History  Substance Use Topics  . Smoking status: Former Research scientist (life sciences)  . Smokeless tobacco: Never Used  . Alcohol use No     Allergies   Patient has no known allergies.   Review of Systems Review of Systems  Constitutional: Negative for chills and fever.  Respiratory: Negative for cough and shortness of breath.   Cardiovascular: Negative for chest pain, palpitations and leg swelling.  Gastrointestinal: Positive for nausea. Negative for abdominal pain, constipation and diarrhea.  Musculoskeletal: Positive for gait problem. Negative for joint swelling, neck pain and neck stiffness.  Skin: Negative for rash and wound.  Neurological: Positive for dizziness, light-headedness and headaches. Negative for syncope, weakness and numbness.  All other systems reviewed and are negative.  A complete 10 system review of systems was obtained and all systems are negative except as noted in the HPI and PMH.    Physical Exam  Updated Vital Signs BP (!) 148/50 (BP Location: Left Arm)   Pulse (!) 51   Temp 97.2 F (36.2 C) (Temporal)   Resp 18   Ht 5\' 4"  (1.626 m)   Wt 175 lb (79.4 kg)   SpO2 100%   BMI 30.04 kg/m   Physical Exam  Constitutional: He appears well-developed and well-nourished. No distress.  HENT:  Head: Normocephalic and atraumatic.    Mouth/Throat: Oropharyngeal exudate present.  Eyes: EOM are normal. Pupils are equal, round, and reactive to light.  Neck: Normal range of motion. Neck supple.  Cardiovascular: Normal rate and regular rhythm.   Murmur (Harsh holosystolic murmur) heard. Pulmonary/Chest: Effort normal and breath sounds normal. No respiratory distress. He has no wheezes. He has no rales. He exhibits no tenderness.  Abdominal: Soft. Bowel sounds are normal. There is no tenderness. There is no rebound and no guarding.  Musculoskeletal: Normal range of motion. He exhibits no edema or tenderness.  Chronic deformities to the left hand and wrist. No lower extremity swelling or asymmetry.  Neurological: He is alert.  Oriented to person and town. Confusion present. 5/5 motor in all extremities. Question past-pointing with finger to nose testing especially in the left hand. Speech appears clear.  Skin: Skin is warm and dry. Capillary refill takes less than 2 seconds. No rash noted. No erythema.  Psychiatric: He has a normal mood and affect. His behavior is normal.  Nursing note and vitals reviewed.    ED Treatments / Results  DIAGNOSTIC STUDIES: Oxygen Saturation is 100% on RA, normal by my interpretation.    COORDINATION OF CARE: 1:06 PM Discussed treatment plan with pt at bedside and pt agreed to plan.  Labs (all labs ordered are listed, but only abnormal results are displayed) Labs Reviewed  COMPREHENSIVE METABOLIC PANEL - Abnormal; Notable for the following:       Result Value   Sodium 134 (*)    BUN 23 (*)    ALT 14 (*)    Total Bilirubin 1.3 (*)    All other components within normal limits  URINALYSIS, ROUTINE W REFLEX MICROSCOPIC - Abnormal; Notable for the following:    Ketones, ur 20 (*)    All other components within normal limits  ETHANOL  PROTIME-INR  APTT  CBC  DIFFERENTIAL  RAPID URINE DRUG SCREEN, HOSP PERFORMED  TROPONIN I    EKG  EKG Interpretation  Date/Time:  Friday December 26 2016 11:42:36 EST Ventricular Rate:  52 PR Interval:  164 QRS Duration: 90 QT Interval:  454 QTC Calculation: 422 R Axis:   -32 Text Interpretation:  Sinus bradycardia Left axis deviation Abnormal ECG Confirmed by Nahomy Limburg  MD, Caley ([id number]) on 12/26/2016 12:12:24 PM       Radiology Ct Head Wo Contrast  Result Date: 12/26/2016 CLINICAL DATA:  Worsening of dizziness, lightheadedness over the last 2 days, some nausea EXAM: CT HEAD WITHOUT CONTRAST TECHNIQUE: Contiguous axial images were obtained from the base of the skull through the vertex without intravenous contrast. COMPARISON:  None. FINDINGS: Brain: The ventricular system is prominent as are the cortical sulci, consistent with diffuse atrophy. The septum is midline in position. The fourth ventricle and basilar cisterns are unremarkable. Moderate small vessel ischemic change is noted throughout the periventricular white matter. No hemorrhage, mass lesion, or acute infarction is seen. Vascular: No vascular abnormality is seen on this unenhanced study. Skull: No calvarial lesion is seen. Sinuses/Orbits: The paranasal sinuses are pneumatized. The maxillary sinuses are not visualized. Other:  None IMPRESSION: Moderate small vessel ischemic change. Atrophy. No acute intracranial abnormality. Electronically Signed   By: Ivar Drape M.D.   On: 12/26/2016 14:17    Procedures Procedures (including critical care time)  Medications Ordered in ED Medications - No data to display   Initial Impression / Assessment and Plan / ED Course  I have reviewed the triage vital signs and the nursing notes.  Pertinent labs & imaging results that were available during my care of the patient were reviewed by me and considered in my medical decision making (see chart for details).  Clinical Course    Patient with persistent bradycardia and blood pressure at times dropping with systolic in the [id number]. Question symptomatically bradycardia versus orthostasis.  Patient becoming increasingly confused. Discussed with hospitalist and we'll admit for observation.    Final Clinical Impressions(s) / ED Diagnoses   Final diagnoses:  Confusion  Orthostasis  Bradycardia    New Prescriptions New Prescriptions   No medications on file  I personally performed the services described in this documentation, which was scribed in my presence. The recorded information has been reviewed and is accurate.      Julianne Rice, MD 12/26/16 [phone]

## 2016-12-26 NOTE — ED Notes (Signed)
Report to Heather, RN.

## 2016-12-26 NOTE — ED Notes (Signed)
Report to Social Services, David Richmond is POC

## 2016-12-26 NOTE — ED Triage Notes (Signed)
Friends states pt complaining of being dizzy a lot, but seemed worse

## 2016-12-26 NOTE — ED Notes (Signed)
Pt at nurses station in room 14 with doors and curtain opened for staff to observe.

## 2016-12-26 NOTE — ED Notes (Signed)
Call for report to [facility], RN

## 2016-12-26 NOTE — ED Notes (Signed)
EKG given to Dr. Cook  

## 2016-12-26 NOTE — ED Notes (Signed)
Transporting pt to room 326 via bed and monitor

## 2016-12-27 ENCOUNTER — Observation Stay (HOSPITAL_COMMUNITY): Payer: Medicare Other

## 2016-12-27 DIAGNOSIS — I10 Essential (primary) hypertension: Secondary | ICD-10-CM | POA: Diagnosis present

## 2016-12-27 DIAGNOSIS — F0280 Dementia in other diseases classified elsewhere without behavioral disturbance: Secondary | ICD-10-CM | POA: Diagnosis present

## 2016-12-27 DIAGNOSIS — G9349 Other encephalopathy: Secondary | ICD-10-CM | POA: Diagnosis present

## 2016-12-27 DIAGNOSIS — E860 Dehydration: Secondary | ICD-10-CM | POA: Diagnosis present

## 2016-12-27 DIAGNOSIS — R4701 Aphasia: Secondary | ICD-10-CM

## 2016-12-27 DIAGNOSIS — Z87891 Personal history of nicotine dependence: Secondary | ICD-10-CM | POA: Diagnosis not present

## 2016-12-27 DIAGNOSIS — R471 Dysarthria and anarthria: Secondary | ICD-10-CM | POA: Diagnosis present

## 2016-12-27 DIAGNOSIS — E785 Hyperlipidemia, unspecified: Secondary | ICD-10-CM | POA: Diagnosis present

## 2016-12-27 DIAGNOSIS — R410 Disorientation, unspecified: Secondary | ICD-10-CM | POA: Diagnosis not present

## 2016-12-27 DIAGNOSIS — I350 Nonrheumatic aortic (valve) stenosis: Secondary | ICD-10-CM | POA: Diagnosis present

## 2016-12-27 DIAGNOSIS — Z7951 Long term (current) use of inhaled steroids: Secondary | ICD-10-CM | POA: Diagnosis not present

## 2016-12-27 DIAGNOSIS — G309 Alzheimer's disease, unspecified: Secondary | ICD-10-CM | POA: Diagnosis present

## 2016-12-27 DIAGNOSIS — E871 Hypo-osmolality and hyponatremia: Secondary | ICD-10-CM | POA: Diagnosis present

## 2016-12-27 DIAGNOSIS — Z79899 Other long term (current) drug therapy: Secondary | ICD-10-CM | POA: Diagnosis not present

## 2016-12-27 DIAGNOSIS — R4182 Altered mental status, unspecified: Secondary | ICD-10-CM | POA: Diagnosis not present

## 2016-12-27 DIAGNOSIS — I951 Orthostatic hypotension: Secondary | ICD-10-CM | POA: Diagnosis present

## 2016-12-27 DIAGNOSIS — R001 Bradycardia, unspecified: Secondary | ICD-10-CM

## 2016-12-27 DIAGNOSIS — R42 Dizziness and giddiness: Secondary | ICD-10-CM | POA: Diagnosis not present

## 2016-12-27 DIAGNOSIS — G9340 Encephalopathy, unspecified: Secondary | ICD-10-CM | POA: Diagnosis not present

## 2016-12-27 LAB — LIPID PANEL
CHOL/HDL RATIO: 4.5 ratio
CHOLESTEROL: 162 mg/dL (ref 0–200)
HDL: 36 mg/dL — AB (ref >40)
LDL Cholesterol: 110 mg/dL — ABNORMAL HIGH (ref 0–99)
TRIGLYCERIDES: 79 mg/dL (ref <150)
VLDL: 16 mg/dL (ref 0–40)

## 2016-12-27 LAB — T4, FREE: Free T4: 1.31 ng/dL — ABNORMAL HIGH (ref 0.61–1.12)

## 2016-12-27 MED ORDER — SODIUM CHLORIDE 0.9 % IV BOLUS (SEPSIS)
500.0000 mL | Freq: Once | INTRAVENOUS | Status: AC
  Administered 2016-12-27: 500 mL via INTRAVENOUS

## 2016-12-27 MED ORDER — HALOPERIDOL LACTATE 5 MG/ML IJ SOLN
2.0000 mg | Freq: Four times a day (QID) | INTRAMUSCULAR | Status: DC | PRN
  Administered 2016-12-27: 2 mg via INTRAVENOUS
  Filled 2016-12-27: qty 1

## 2016-12-27 MED ORDER — MEMANTINE HCL ER 28 MG PO CP24
28.0000 mg | ORAL_CAPSULE | Freq: Every day | ORAL | Status: DC
  Administered 2016-12-27 – 2016-12-30 (×4): 28 mg via ORAL
  Filled 2016-12-27 (×5): qty 1

## 2016-12-27 MED ORDER — DONEPEZIL HCL 5 MG PO TABS
10.0000 mg | ORAL_TABLET | Freq: Every day | ORAL | Status: DC
  Administered 2016-12-27 – 2016-12-30 (×4): 10 mg via ORAL
  Filled 2016-12-27 (×4): qty 2

## 2016-12-27 MED ORDER — SODIUM CHLORIDE 0.9 % IV SOLN
INTRAVENOUS | Status: AC
  Administered 2016-12-27 – 2016-12-28 (×3): via INTRAVENOUS

## 2016-12-27 NOTE — Progress Notes (Signed)
PROGRESS NOTE                                                                                                                                                                                                             Patient Demographics:    David Richmond, is a 74 y.o. male, DOB - Aug 09, 1941, [id number]  Admit date - 12/26/2016   Admitting Physician Vianne Bulls, MD  Outpatient Primary MD for the patient is Neale Burly, MD  LOS - 0  Chief Complaint  Patient presents with  . Altered Mental Status       Brief Narrative     David Richmond is a 75 y.o. male with medical history significant for hypertension, hyperlipidemia, and dementia who presents to the emergency department for evaluation of confusion and slurred speech. Patient is brought in by a friend who is concerned that for the past 3 days, the patient has seemed quite confused and is slurring his words. Patient is said to have known dementia with at least 1 year of slowly progressive confusion, but there has been an acute change noted over the past 3 days where the patient is no longer able to hold a conversation, stopping midsentence, either unable to find the word he was looking for, or forgetting what he was talking about.   There has been no known recent trauma or head injury and no recent change in his medications. Patient is said to be a non-drinker and is not known to use illicit substances. Patient denies any pain, and specifically denies headache, chest pain, or abdominal pain. Patient lives alone with a friend that checks on him periodically.    Subjective:    David Richmond today has, No headache, No chest pain, No abdominal pain - No Nausea, No new weakness tingling or numbness, No Cough - SOB.     Assessment  & Plan :     1. Questionable expressive aphasia/dysarthria. I think he has gradually progressive dementia with extreme weakness and confusion due  to severe orthostatic hypotension.   Head CT and MRI brain nonacute, he has no focal deficits, he is oriented 2, does not appear to have dysarthria or aphasia at this point. No facial droop. We will hydrate him, will have him seen by speech. Monitor orthostatics.  2. Orthostatic hypotension. Most likely due to  dehydration. IV fluids, and stockings, if needed will add midodrine.  3. Underlying dementia. The new home regimen.  4. Dyslipidemia. Continue statin.  5. Sinus bradycardia. With good supine blood pressures, we'll check for chronotropic response, TSH stable on no nodal blocking agents.    Family Communication  :  None  Code Status :  Full  Diet : Diet Heart Room service appropriate? Yes; Fluid consistency: Thin    Disposition Plan  :  Home 1-2 days  Consults  :  None  Procedures  :    MRI Brain - Non acute  DVT Prophylaxis  :  Lovenox    Lab Results  Component Value Date   PLT 198 12/26/2016    Inpatient Medications  Scheduled Meds: . atorvastatin  10 mg Oral QHS  . memantine  28 mg Oral Daily   And  . donepezil  10 mg Oral Daily  . enoxaparin (LOVENOX) injection  40 mg Subcutaneous Q24H  . fluticasone  1 spray Each Nare Daily  . isosorbide mononitrate  30 mg Oral Daily  . ketoconazole   Topical BID  . mometasone-formoterol  2 puff Inhalation BID  . sodium chloride  500 mL Intravenous Once  . triamcinolone   Topical BID   Continuous Infusions: . sodium chloride     PRN Meds:.[DISCONTINUED] acetaminophen **OR** acetaminophen (TYLENOL) oral liquid 160 mg/5 mL **OR** [DISCONTINUED] acetaminophen, albuterol, meclizine, senna-docusate  Antibiotics  :    Anti-infectives    None         Objective:   Vitals:   12/27/16 0530 12/27/16 0730 12/27/16 0821 12/27/16 0930  BP: (!) 155/60 135/69  (!) 157/61  Pulse: (!) 45 (!) 54  61  Resp: 20 20  20   Temp: 97.5 F (36.4 C) 97.6 F (36.4 C)  97.6 F (36.4 C)  TempSrc: Oral     SpO2: 100% 98% 99% 98%    Weight:      Height:        Wt Readings from Last 3 Encounters:  12/26/16 79.4 kg (175 lb)     Intake/Output Summary (Last 24 hours) at 12/27/16 1155 Last data filed at 12/27/16 0300  Gross per 24 hour  Intake           588.33 ml  Output              250 ml  Net           338.33 ml     Physical Exam  Awake Alert, Oriented X 2, No new F.N deficits, Normal affect [place].AT,PERRAL Supple Neck,No JVD, No cervical lymphadenopathy appriciated.  Symmetrical Chest wall movement, Good air movement bilaterally, CTAB RRR,No Gallops,Rubs or new Murmurs, No Parasternal Heave +ve B.Sounds, Abd Soft, No tenderness, No organomegaly appriciated, No rebound - guarding or rigidity. No Cyanosis, Clubbing or edema, No new Rash or bruise      Data Review:    CBC  Recent Labs Lab 12/26/16 1318  WBC 7.1  HGB 16.0  HCT 46.9  PLT 198  MCV 98.7  MCH 33.7  MCHC 34.1  RDW 13.1  LYMPHSABS 1.5  MONOABS 0.9  EOSABS 0.1  BASOSABS 0.0    Chemistries   Recent Labs Lab 12/26/16 1318  NA 134*  K 4.0  CL 101  CO2 27  GLUCOSE 83  BUN 23*  CREATININE 1.03  CALCIUM 9.2  AST 24  ALT 14*  ALKPHOS 71  BILITOT 1.3*   ------------------------------------------------------------------------------------------------------------------  Recent Labs  12/27/16 0701  CHOL 162  HDL 36*  LDLCALC 110*  TRIG 79  CHOLHDL 4.5    No results found for: HGBA1C ------------------------------------------------------------------------------------------------------------------  Recent Labs  12/26/16 2110  TSH 1.673   ------------------------------------------------------------------------------------------------------------------ No results for input(s): VITAMINB12, FOLATE, FERRITIN, TIBC, IRON, RETICCTPCT in the last 72 hours.  Coagulation profile  Recent Labs Lab 12/26/16 1318  INR 0.99    No results for input(s): DDIMER in the last 72 hours.  Cardiac Enzymes  Recent Labs Lab  12/26/16 1318  TROPONINI <0.03   ------------------------------------------------------------------------------------------------------------------ No results found for: BNP  Micro Results No results found for this or any previous visit (from the past 240 hour(s)).  Radiology Reports Ct Head Wo Contrast  Result Date: 12/26/2016 CLINICAL DATA:  Worsening of dizziness, lightheadedness over the last 2 days, some nausea EXAM: CT HEAD WITHOUT CONTRAST TECHNIQUE: Contiguous axial images were obtained from the base of the skull through the vertex without intravenous contrast. COMPARISON:  None. FINDINGS: Brain: The ventricular system is prominent as are the cortical sulci, consistent with diffuse atrophy. The septum is midline in position. The fourth ventricle and basilar cisterns are unremarkable. Moderate small vessel ischemic change is noted throughout the periventricular white matter. No hemorrhage, mass lesion, or acute infarction is seen. Vascular: No vascular abnormality is seen on this unenhanced study. Skull: No calvarial lesion is seen. Sinuses/Orbits: The paranasal sinuses are pneumatized. The maxillary sinuses are not visualized. Other: None IMPRESSION: Moderate small vessel ischemic change. Atrophy. No acute intracranial abnormality. Electronically Signed   By: Ivar Drape M.D.   On: 12/26/2016 14:17   Mr Brain Wo Contrast  Result Date: 12/27/2016 CLINICAL DATA:  75 year old male with dizziness, confusion, slurred speech, near syncope. Initial encounter. EXAM: MRI HEAD WITHOUT CONTRAST TECHNIQUE: Multiplanar, multiecho pulse sequences of the brain and surrounding structures were obtained without intravenous contrast. COMPARISON:  Head CT without contrast 12/26/2016. FINDINGS: Brain: No restricted diffusion to suggest acute infarction. No midline shift, mass effect, evidence of mass lesion, ventriculomegaly, extra-axial collection or acute intracranial hemorrhage. Cervicomedullary junction  and pituitary are within normal limits. Scattered and patchy bilateral cerebral white matter T2 and FLAIR hyperintensity, in a nonspecific configuration. No cortical encephalomalacia or chronic cerebral blood products identified. Nonspecific T2 heterogeneity in the right globus pallidus. Other deep gray matter nuclei, the brainstem, and the cerebellum are normal for age. Vascular: Major intracranial vascular flow voids are preserved. Skull and upper cervical spine: Negative. Normal bone marrow signal. Sinuses/Orbits: Postoperative changes to both globes. Otherwise negative orbits soft tissues. Trace paranasal sinus mucosal thickening. Other: Visible internal auditory structures appear normal. Mastoids are clear. Negative scalp soft tissues. IMPRESSION: 1.  No acute intracranial abnormality. 2. Mild to moderate for age nonspecific cerebral white matter signal changes, most commonly due to chronic small vessel disease. Electronically Signed   By: Genevie Ann M.D.   On: 12/27/2016 09:59   Mr Jodene Nam Head/brain [id number] Cm  Result Date: 12/27/2016 CLINICAL DATA:  75 year old male with dizziness, confusion, slurred speech, near syncope. Initial encounter. EXAM: MRA HEAD WITHOUT CONTRAST TECHNIQUE: Angiographic images of the Circle of Willis were obtained using MRA technique without intravenous contrast. COMPARISON:  Brain MRI today reported separately. FINDINGS: Antegrade flow in the posterior circulation with dominant distal left vertebral artery. Mild vertebral tortuosity without stenosis. Patent PICA origins and vertebrobasilar junction. Mild basilar tortuosity and irregularity without stenosis. SCA and PCA origins are normal. Posterior communicating arteries are diminutive or absent. Bilateral PCA branches are within normal limits. Antegrade flow in both ICA  siphons. Severe tortuosity of both distal cervical ICAs. Mild to moderate siphon irregularity in keeping with atherosclerosis. No hemodynamically significant siphon  stenosis. Ophthalmic artery origins, carotid termini, MCA and ACA origins are normal. Mildly dominant left ACA A1 segment. Anterior communicating artery and visualized ACA branches are within normal limits. Mild bilateral left MCA M1 segment irregularity and stenosis. Both MCA bifurcations are patent. No MCA branch occlusion identified. 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 Electronically Signed   By: Genevie Ann M.D.   On: 12/27/2016 10:02    Time Spent in minutes  30   SINGH,PRASHANT K M.D on 12/27/2016 at 11:55 AM  Between 7am to 7pm - Pager - [phone]  After 7pm go to [email or web address] - password [facility]  [facility] -  Office  [phone]

## 2016-12-27 NOTE — Evaluation (Signed)
Physical Therapy Evaluation Patient Details Name: David Richmond MRN: [id number] DOB: [birth year] Today's Date: 12/27/2016   History of Present Illness  David Richmond is a 75 y.o. male with medical history significant for hypertension, hyperlipidemia, and dementia who presents to the emergency department for evaluation of confusion and slurred speech. Patient is brought in by a friend who is concerned that for the past 3 days, the patient has seemed quite confused and is slurring his words. Patient is said to have known dementia with at least 1 year of slowly progressive confusion, but there has been an acute change noted over the past 3 days where the patient is no longer able to hold a conversation, stopping midsentence, either unable to find the word he was looking for, or forgetting what he was talking about. There has been no known recent trauma or head injury and no recent change in his medications. Patient is said to be a non-drinker and is not known to use illicit substances. Patient denies any pain, and specifically denies headache, chest pain, or abdominal pain. Patient lives alone with a friend that checks on him periodically.   Clinical Impression  Patient found standing up in his room by the side of the bed by himself, very friendly and willing to participate in skilled PT services. Appears A&O x2 and was not able to complete serial 7s drill or recall 3 words that were introduced to him earlier in the session; pleasant but easily distracted and required cues for focus and safety throughout session. Did not note any major functional strength deficits in LES or core at this time; patient able to perform all functional mobility with I-Mod(I). Able to ambulate with no device with min guard however did intermittently have mild LOB which required min guard to correct; family friend states that this is really patient's baseline due to vertigo, he has had mild unsteadiness for some time. Patient able to  complete stairs with min guard and U railing today, reciprocal pattern ascending and step to pattern for descent. At this point patient's main impairments that PT can attempt to address appear related to balance and safety awareness, however, a speech therapy consult is strongly recommended. Patient will receive skilled PT services during his stay in this facility and will benefit from [place] and 24/7 supervision when he is discharged by MD. Patient left in bed with HOB elevated, visitor in room, bed alarm activated, and all needs otherwise met. Family friend reports that the patient has no family in the area that can help him, and all of his friends have limited resources/cannot be with him all the time either.     Follow Up Recommendations Home health PT;Supervision/Assistance - 24 hour    Equipment Recommendations  Standard walker    Recommendations for Other Services Speech consult;OT consult     Precautions / Restrictions Precautions Precautions: Fall Precaution Comments: poor safety awareness  Restrictions Weight Bearing Restrictions: No      Mobility  Bed Mobility Overal bed mobility: Independent                Transfers Overall transfer level: Modified independent Equipment used: None                Ambulation/Gait Ambulation/Gait assistance: Min guard Ambulation Distance (Feet): 80 Feet Assistive device: None Gait Pattern/deviations: Step-through pattern;Scissoring;Trendelenburg;Trunk flexed   Gait velocity interpretation: <1.8 ft/sec, indicative of risk for recurrent falls General Gait Details: intermittent LOB noted with gait requiring min guard to correct, cues for  safety; friends report this is his baseline due to vertigo   Stairs Stairs: Yes Stairs assistance: Min guard Stair Management: One rail Right   General stair comments: reciprocal pattern ascending, step by step pattern descending   Wheelchair Mobility    Modified Rankin (Stroke Patients  Only)       Balance Overall balance assessment: Needs assistance Sitting-balance support: No upper extremity supported Sitting balance-Leahy Scale: Good     Standing balance support: No upper extremity supported Standing balance-Leahy Scale: Fair                               Pertinent Vitals/Pain Pain Assessment: No/denies pain    Home Living Family/patient expects to be discharged to:: Private residence Living Arrangements: Alone Available Help at Discharge: Other (Comment) (no family in the area and friends are only available intermittently ) Type of Home: House Home Access: Stairs to enter       Home Equipment: Environmental consultant - 2 wheels;Cane - single point      Prior Function Level of Independence: Independent         Comments: patient lived by himself with friends intermittently checking in on him      Hand Dominance        Extremity/Trunk Assessment   Upper Extremity Assessment Upper Extremity Assessment: Defer to OT evaluation    Lower Extremity Assessment Lower Extremity Assessment: Overall WFL for tasks assessed    Cervical / Trunk Assessment Cervical / Trunk Assessment: Normal  Communication   Communication: No difficulties  Cognition Arousal/Alertness: Awake/alert Behavior During Therapy: WFL for tasks assessed/performed Overall Cognitive Status: Impaired/Different from baseline Area of Impairment: Orientation;Memory;Attention;Safety/judgement Orientation Level: Person;Place Current Attention Level: Alternating Memory: Decreased short-term memory   Safety/Judgement: Decreased awareness of safety     General Comments: unable to count backwards by 7s or identify 3 words introduced to him earlier; poor attention and needed cues to stay on task, cues for safety     General Comments General comments (skin integrity, edema, etc.): friends report patient is a bit unsteady at baseline due to vertigo     Exercises     Assessment/Plan     PT Assessment Patient needs continued PT services  PT Problem List Decreased safety awareness;Decreased mobility;Decreased coordination;Decreased balance          PT Treatment Interventions Therapeutic activities;Gait training;Therapeutic exercise;Patient/family education;Stair training;Balance training;Neuromuscular re-education    PT Goals (Current goals can be found in the Care Plan section)  Acute Rehab PT Goals PT Goal Formulation: Patient unable to participate in goal setting    Frequency Min 3X/week   Barriers to discharge Decreased caregiver support poor safety awareness, needs 24/7 supervision     Co-evaluation               End of Session Equipment Utilized During Treatment: Gait belt Activity Tolerance: Patient tolerated treatment well Patient left: in bed;with call bell/phone within reach;with family/visitor present;with bed alarm set Nurse Communication: Other (comment) (social worker mobility status )    Functional Assessment Tool Used: Based on skilled PT assessment of strength, balance, gait, stair navigation, safety awareness  Functional Limitation: Mobility: Walking and moving around Mobility: Walking and Moving Around Current Status (T6256): At least 20 percent but less than 40 percent impaired, limited or restricted Mobility: Walking and Moving Around Goal Status [phone]): At least 1 percent but less than 20 percent impaired, limited or restricted    Time: 1050-1110  PT Time Calculation (min) (ACUTE ONLY): 20 min   Charges:   PT Evaluation $PT Eval Moderate Complexity: 1 Procedure     PT G Codes:   PT G-Codes **NOT FOR INPATIENT CLASS** Functional Assessment Tool Used: Based on skilled PT assessment of strength, balance, gait, stair navigation, safety awareness  Functional Limitation: Mobility: Walking and moving around Mobility: Walking and Moving Around Current Status ([id number]): At least 20 percent but less than 40 percent impaired, limited or  restricted Mobility: Walking and Moving Around Goal Status [phone]): At least 1 percent but less than 20 percent impaired, limited or restricted    Deniece Ree PT, DPT [phone]

## 2016-12-27 NOTE — Progress Notes (Signed)
ECHO DONE 

## 2016-12-28 LAB — BASIC METABOLIC PANEL
ANION GAP: 4 — AB (ref 5–15)
BUN: 14 mg/dL (ref 6–20)
CALCIUM: 8.1 mg/dL — AB (ref 8.9–10.3)
CHLORIDE: 110 mmol/L (ref 101–111)
CO2: 24 mmol/L (ref 22–32)
CREATININE: 0.88 mg/dL (ref 0.61–1.24)
GFR calc non Af Amer: >60 mL/min (ref >60)
Glucose, Bld: 88 mg/dL (ref 65–99)
Potassium: 3.4 mmol/L — ABNORMAL LOW (ref 3.5–5.1)
SODIUM: 138 mmol/L (ref 135–145)

## 2016-12-28 LAB — HEMOGLOBIN A1C
HEMOGLOBIN A1C: 5 % (ref 4.8–5.6)
MEAN PLASMA GLUCOSE: 97 mg/dL

## 2016-12-28 LAB — ECHOCARDIOGRAM COMPLETE
Height: 64 in
Weight: 2800 oz

## 2016-12-28 MED ORDER — POTASSIUM CHLORIDE CRYS ER 20 MEQ PO TBCR
40.0000 meq | EXTENDED_RELEASE_TABLET | Freq: Once | ORAL | Status: AC
  Administered 2016-12-28: 40 meq via ORAL
  Filled 2016-12-28: qty 2

## 2016-12-28 NOTE — Progress Notes (Signed)
PROGRESS NOTE                                                                                                                                                                                                             Patient Demographics:    David Richmond, is a 75 y.o. male, DOB - 1941-11-14, [id number]  Admit date - 12/26/2016   Admitting Physician Vianne Bulls, MD  Outpatient Primary MD for the patient is Neale Burly, MD  LOS - 1  Chief Complaint  Patient presents with  . Altered Mental Status       Brief Narrative     David Richmond is a 75 y.o. male with medical history significant for hypertension, hyperlipidemia, and dementia who presents to the emergency department for evaluation of confusion and slurred speech. Patient is brought in by a friend who is concerned that for the past 3 days, the patient has seemed quite confused and is slurring his words. Patient is said to have known dementia with at least 1 year of slowly progressive confusion, but there has been an acute change noted over the past 3 days where the patient is no longer able to hold a conversation, stopping midsentence, either unable to find the word he was looking for, or forgetting what he was talking about.   There has been no known recent trauma or head injury and no recent change in his medications. Patient is said to be a non-drinker and is not known to use illicit substances. Patient denies any pain, and specifically denies headache, chest pain, or abdominal pain. Patient lives alone with a friend that checks on him periodically.    Subjective:    David Richmond today has, No headache, No chest pain, No abdominal pain - No Nausea, No new weakness tingling or numbness, No Cough - SOB.     Assessment  & Plan :     1. Questionable expressive aphasia/dysarthria. I think he has gradually progressive dementia with extreme weakness and confusion due  to severe orthostatic hypotension.   Head CT and MRI brain nonacute, he has no focal deficits, he is oriented 2, does not appear to have dysarthria or aphasia at this point. Speech to see but clinically close to baseline. No facial droop. Improved with hydration, will have him seen by speech. Monitor orthostatics. TTE  pending.  2. Orthostatic hypotension. Most likely due to dehydration. IV fluids, and stockings, BP better now.  3. Underlying dementia. The new home regimen.  4. Dyslipidemia. Continue statin.  5. Sinus bradycardia. With good supine blood pressures, good chronotropic response, TSH stable on no nodal blocking agents. TTE pending.    Family Communication  :  None  Code Status :  Full  Diet : Diet Heart Room service appropriate? Yes; Fluid consistency: Thin    Disposition Plan  :  DC in am likely with HHPT  Consults  :  None  Procedures  :    MRI Brain - Non acute  TTE  DVT Prophylaxis  :  Lovenox    Lab Results  Component Value Date   PLT 198 12/26/2016    Inpatient Medications  Scheduled Meds: . atorvastatin  10 mg Oral QHS  . memantine  28 mg Oral Daily   And  . donepezil  10 mg Oral Daily  . enoxaparin (LOVENOX) injection  40 mg Subcutaneous Q24H  . fluticasone  1 spray Each Nare Daily  . isosorbide mononitrate  30 mg Oral Daily  . ketoconazole   Topical BID  . mometasone-formoterol  2 puff Inhalation BID  . potassium chloride  40 mEq Oral Once  . triamcinolone   Topical BID   Continuous Infusions: . sodium chloride 75 mL/hr at 12/28/16 0919   PRN Meds:.[DISCONTINUED] acetaminophen **OR** acetaminophen (TYLENOL) oral liquid 160 mg/5 mL **OR** [DISCONTINUED] acetaminophen, albuterol, haloperidol lactate, meclizine, senna-docusate  Antibiotics  :    Anti-infectives    None         Objective:   Vitals:   12/27/16 2213 12/28/16 0130 12/28/16 0530 12/28/16 0824  BP:  130/78 120/90   Pulse:  74 (!) 53   Resp:  20 20   Temp:  98 F (36.7  C) 97.2 F (36.2 C)   TempSrc:  Oral Oral   SpO2: 98% 97% 95% 96%  Weight:      Height:        Wt Readings from Last 3 Encounters:  12/26/16 79.4 kg (175 lb)     Intake/Output Summary (Last 24 hours) at 12/28/16 0925 Last data filed at 12/28/16 0842  Gross per 24 hour  Intake          2131.25 ml  Output              300 ml  Net          1831.25 ml     Physical Exam  Awake Alert, Oriented X 2, No new F.N deficits, Normal affect David Richmond.AT,PERRAL Supple Neck,No JVD, No cervical lymphadenopathy appriciated.  Symmetrical Chest wall movement, Good air movement bilaterally, CTAB RRR,No Gallops,Rubs or new Murmurs, No Parasternal Heave +ve B.Sounds, Abd Soft, No tenderness, No organomegaly appriciated, No rebound - guarding or rigidity. No Cyanosis, Clubbing or edema, No new Rash or bruise      Data Review:    CBC  Recent Labs Lab 12/26/16 1318  WBC 7.1  HGB 16.0  HCT 46.9  PLT 198  MCV 98.7  MCH 33.7  MCHC 34.1  RDW 13.1  LYMPHSABS 1.5  MONOABS 0.9  EOSABS 0.1  BASOSABS 0.0    Chemistries   Recent Labs Lab 12/26/16 1318 12/28/16 0645  NA 134* 138  K 4.0 3.4*  CL 101 110  CO2 27 24  GLUCOSE 83 88  BUN 23* 14  CREATININE 1.03 0.88  CALCIUM 9.2 8.1*  AST 24  --  ALT 14*  --   ALKPHOS 71  --   BILITOT 1.3*  --    ------------------------------------------------------------------------------------------------------------------  Recent Labs  12/27/16 0701  CHOL 162  HDL 36*  LDLCALC 110*  TRIG 79  CHOLHDL 4.5    No results found for: HGBA1C ------------------------------------------------------------------------------------------------------------------  Recent Labs  12/26/16 2110  TSH 1.673   ------------------------------------------------------------------------------------------------------------------ No results for input(s): VITAMINB12, FOLATE, FERRITIN, TIBC, IRON, RETICCTPCT in the last 72 hours.  Coagulation profile  Recent  Labs Lab 12/26/16 1318  INR 0.99    No results for input(s): DDIMER in the last 72 hours.  Cardiac Enzymes  Recent Labs Lab 12/26/16 1318  TROPONINI <0.03   ------------------------------------------------------------------------------------------------------------------ No results found for: BNP  Micro Results No results found for this or any previous visit (from the past 240 hour(s)).  Radiology Reports Ct Head Wo Contrast  Result Date: 12/26/2016 CLINICAL DATA:  Worsening of dizziness, lightheadedness over the last 2 days, some nausea EXAM: CT HEAD WITHOUT CONTRAST TECHNIQUE: Contiguous axial images were obtained from the base of the skull through the vertex without intravenous contrast. COMPARISON:  None. FINDINGS: Brain: The ventricular system is prominent as are the cortical sulci, consistent with diffuse atrophy. The septum is midline in position. The fourth ventricle and basilar cisterns are unremarkable. Moderate small vessel ischemic change is noted throughout the periventricular white matter. No hemorrhage, mass lesion, or acute infarction is seen. Vascular: No vascular abnormality is seen on this unenhanced study. Skull: No calvarial lesion is seen. Sinuses/Orbits: The paranasal sinuses are pneumatized. The maxillary sinuses are not visualized. Other: None IMPRESSION: Moderate small vessel ischemic change. Atrophy. No acute intracranial abnormality. Electronically Signed   By: Ivar Drape M.D.   On: 12/26/2016 14:17   Mr Brain Wo Contrast  Result Date: 12/27/2016 CLINICAL DATA:  75 year old male with dizziness, confusion, slurred speech, near syncope. Initial encounter. EXAM: MRI HEAD WITHOUT CONTRAST TECHNIQUE: Multiplanar, multiecho pulse sequences of the brain and surrounding structures were obtained without intravenous contrast. COMPARISON:  Head CT without contrast 12/26/2016. FINDINGS: Brain: No restricted diffusion to suggest acute infarction. No midline shift, mass  effect, evidence of mass lesion, ventriculomegaly, extra-axial collection or acute intracranial hemorrhage. Cervicomedullary junction and pituitary are within normal limits. Scattered and patchy bilateral cerebral white matter T2 and FLAIR hyperintensity, in a nonspecific configuration. No cortical encephalomalacia or chronic cerebral blood products identified. Nonspecific T2 heterogeneity in the right globus pallidus. Other deep gray matter nuclei, the brainstem, and the cerebellum are normal for age. Vascular: Major intracranial vascular flow voids are preserved. Skull and upper cervical spine: Negative. Normal bone marrow signal. Sinuses/Orbits: Postoperative changes to both globes. Otherwise negative orbits soft tissues. Trace paranasal sinus mucosal thickening. Other: Visible internal auditory structures appear normal. Mastoids are clear. Negative scalp soft tissues. IMPRESSION: 1.  No acute intracranial abnormality. 2. Mild to moderate for age nonspecific cerebral white matter signal changes, most commonly due to chronic small vessel disease. Electronically Signed   By: Genevie Ann M.D.   On: 12/27/2016 09:59   Mr Jodene Nam Head/brain [id number] Cm  Result Date: 12/27/2016 CLINICAL DATA:  75 year old male with dizziness, confusion, slurred speech, near syncope. Initial encounter. EXAM: MRA HEAD WITHOUT CONTRAST TECHNIQUE: Angiographic images of the Circle of Willis were obtained using MRA technique without intravenous contrast. COMPARISON:  Brain MRI today reported separately. FINDINGS: Antegrade flow in the posterior circulation with dominant distal left vertebral artery. Mild vertebral tortuosity without stenosis. Patent PICA origins and vertebrobasilar junction. Mild basilar tortuosity and irregularity  without stenosis. SCA and PCA origins are normal. Posterior communicating arteries are diminutive or absent. Bilateral PCA branches are within normal limits. Antegrade flow in both ICA siphons. Severe tortuosity of both  distal cervical ICAs. Mild to moderate siphon irregularity in keeping with atherosclerosis. No hemodynamically significant siphon stenosis. Ophthalmic artery origins, carotid termini, MCA and ACA origins are normal. Mildly dominant left ACA A1 segment. Anterior communicating artery and visualized ACA branches are within normal limits. Mild bilateral left MCA M1 segment irregularity and stenosis. Both MCA bifurcations are patent. No MCA branch occlusion identified. 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 Electronically Signed   By: Genevie Ann M.D.   On: 12/27/2016 10:02    Time Spent in minutes  30   SINGH,PRASHANT K M.D on 12/28/2016 at 9:25 AM  Between 7am to 7pm - Pager - [phone]  After 7pm go to [email or web address] - password [facility]  [facility] -  Office  [phone]

## 2016-12-29 LAB — POTASSIUM: Potassium: 3.6 mmol/L (ref 3.5–5.1)

## 2016-12-29 MED ORDER — TUBERCULIN PPD 5 UNIT/0.1ML ID SOLN
5.0000 [IU] | Freq: Once | INTRADERMAL | Status: DC
  Administered 2016-12-29: 5 [IU] via INTRADERMAL
  Filled 2016-12-29: qty 0.1

## 2016-12-29 NOTE — Progress Notes (Signed)
Physical Therapy Treatment Patient Details Name: David Richmond MRN: [id number] DOB: 15-Mar-1941 Today's Date: 12/29/2016    History of Present Illness David Richmond is a 76 y.o. male with medical history significant for hypertension, hyperlipidemia, and dementia who presents to the emergency department for evaluation of confusion and slurred speech. Patient is brought in by a friend who is concerned that for the past 3 days, the patient has seemed quite confused and is slurring his words. Patient is said to have known dementia with at least 1 year of slowly progressive confusion, but there has been an acute change noted over the past 3 days where the patient is no longer able to hold a conversation, stopping midsentence, either unable to find the word he was looking for, or forgetting what he was talking about. There has been no known recent trauma or head injury and no recent change in his medications. Patient is said to be a non-drinker and is not known to use illicit substances. Patient denies any pain, and specifically denies headache, chest pain, or abdominal pain. Patient lives alone with a friend that checks on him periodically.     PT Comments    Pt supine in bed upon therapist entrance, agreeable to participate with therapy today.  No reports of pain.  Pt refused to ambulate with AD.  No noted LOB episodes during session, pt did ambulate with various speed during gait training and required SBA for safety with obstacles in floor.  Pt left in chair at EOS with call bell within reach and chair alarm set.  OT screening was complete initially this session.    Follow Up Recommendations        Equipment Recommendations       Recommendations for Other Services       Precautions / Restrictions Precautions Precautions: Fall Precaution Comments: poor safety awareness  Restrictions Weight Bearing Restrictions: No    Mobility  Bed Mobility Overal bed mobility: Independent                 Transfers Overall transfer level: Independent Equipment used: None                Ambulation/Gait Ambulation/Gait assistance: Min guard Ambulation Distance (Feet): 450 Feet Assistive device: None Gait Pattern/deviations: Step-through pattern;Scissoring;Trendelenburg;Trunk flexed     General Gait Details: no LOB, various speed during gait training; increased cueing for safety   Stairs            Wheelchair Mobility    Modified Rankin (Stroke Patients Only)       Balance                                    Cognition Arousal/Alertness: Awake/alert Behavior During Therapy: WFL for tasks assessed/performed Overall Cognitive Status: History of cognitive impairments - at baseline Area of Impairment:  (slow recall)               General Comments: poor attention, needed cueing to stay on task and for safety    Exercises      General Comments        Pertinent Vitals/Pain Pain Assessment: No/denies pain    Home Living Family/patient expects to be discharged to:: Private residence Living Arrangements: Alone Available Help at Discharge: Other (Comment) (Pt states that friends check on him every day. He is alone for long periods of time.) Type of Home: House Home Access: Stairs to  enter     Home Equipment: Walker - 2 wheels;Cane - single point      Prior Function Level of Independence: Independent      Comments: patient lived by himself with friends intermittently checking in on him. Pt states that he completes all bathing, dressing, and meal prep independently. patient continues to drive if needed.    PT Goals (current goals can now be found in the care plan section) Progress towards PT goals: Progressing toward goals    Frequency           PT Plan Current plan remains appropriate    Co-evaluation   Reason for Co-Treatment: To address functional/ADL transfers   OT goals addressed during session: ADL's and  self-care;Strengthening/ROM     End of Session Equipment Utilized During Treatment: Gait belt Activity Tolerance: Patient tolerated treatment well Patient left: in chair;with call bell/phone within reach;with chair alarm set     Time: [id number] PT Time Calculation (min) (ACUTE ONLY): 22 min  Charges:  $Gait Training: 8-22 mins                    G Codes:     Ihor Austin, [place]; CBIS [phone]  Aldona Lento 12/29/2016, 12:56 PM

## 2016-12-29 NOTE — Clinical Social Work Placement (Signed)
   CLINICAL SOCIAL WORK PLACEMENT  NOTE  Date:  12/29/2016  Patient Details  Name: David Richmond MRN: [id number] Date of Birth: 01-Apr-1941  Clinical Social Work is seeking post-discharge placement for this patient at the [place] level of care (*CSW will initial, date and re-position this form in  chart as items are completed):  Yes   Patient/family provided with [place] Work Department's list of facilities offering this level of care within the geographic area requested by the patient (or if unable, by the patient's family).  Yes   Patient/family informed of their freedom to choose among providers that offer the needed level of care, that participate in Medicare, Medicaid or managed care program needed by the patient, have an available bed and are willing to accept the patient.  Yes   Patient/family informed of [facility]'s ownership interest in [facility] and [facility] as well as of the fact that they are under no obligation to receive care at these facilities.  PASRR submitted to EDS on 12/29/16     PASRR number received on 12/29/16     Existing PASRR number confirmed on       FL2 transmitted to all facilities in geographic area requested by pt/family on 12/29/16     FL2 transmitted to all facilities within larger geographic area on       Patient informed that his/her managed care company has contracts with or will negotiate with certain facilities, including the following:            Patient/family informed of bed offers received.  Patient chooses bed at       Physician recommends and patient chooses bed at      Patient to be transferred to   on  .  Patient to be transferred to facility by       Patient family notified on   of transfer.  Name of family member notified:        PHYSICIAN       Additional Comment:    _______________________________________________ Salome Arnt, [place] 12/29/2016, 3:17  PM [phone]
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
## 2016-12-29 NOTE — Progress Notes (Signed)
PROGRESS NOTE                                                                                                                                                                                                             Patient Demographics:    David Richmond, is a 76 y.o. male, DOB - Feb 19, 1941, [id number]  Admit date - 12/26/2016   Admitting Physician Vianne Bulls, MD  Outpatient Primary MD for the patient is Neale Burly, MD  LOS - 2  Chief Complaint  Patient presents with  . Altered Mental Status       Brief Narrative     David Richmond is a 76 y.o. male with medical history significant for hypertension, hyperlipidemia, and dementia who presents to the emergency department for evaluation of confusion and slurred speech. Patient is brought in by a friend who is concerned that for the past 3 days, the patient has seemed quite confused and is slurring his words. Patient is said to have known dementia with at least 1 year of slowly progressive confusion, but there has been an acute change noted over the past 3 days where the patient is no longer able to hold a conversation, stopping midsentence, either unable to find the word he was looking for, or forgetting what he was talking about.   There has been no known recent trauma or head injury and no recent change in his medications. Patient is said to be a non-drinker and is not known to use illicit substances. Patient denies any pain, and specifically denies headache, chest pain, or abdominal pain. Patient lives alone with a friend that checks on him periodically.    Subjective:    David Richmond today has, No headache, No chest pain, No abdominal pain - No Nausea, No new weakness tingling or numbness, No Cough - SOB.     Assessment  & Plan :     1. Questionable expressive aphasia/dysarthria. I think he has gradually progressive dementia with extreme weakness and confusion due  to severe orthostatic hypotension.   Head CT and MRI brain nonacute, he has no focal deficits, he is oriented 2, does not appear to have dysarthria or aphasia at this point. Speech to see but clinically close to baseline. No facial droop. Improved with hydration, will have him seen by speech. Monitor orthostatics.  2. Orthostatic hypotension. Most likely due to dehydration. IV fluids, and stockings, BP better now.  3. Underlying dementia. Continue home regimen. He lives alone and I do not think he is safe going alone to his home, I do not think he can make an informed decision about his disposition due to his underlying dementia, he still drives which I think is dangerous. I think he should be placed in a monitored facility will request PCP to reevaluate his driving status.  4. Dyslipidemia. Continue statin.  5. Sinus bradycardia. With good supine blood pressures, good chronotropic response, TSH stable on no nodal blocking agents. TTE nonacute.  6. Moderate aortic stenosis. Avoid preload reduction, outpatient cardiology follow-up.    Family Communication  :  None  Code Status :  Full  Diet : Diet Heart Room service appropriate? Yes; Fluid consistency: Thin    Disposition Plan  :  SNF  Consults  :  None  Procedures  :    MRI Brain - Non acute  TTE - - Left ventricle: The cavity size was normal. Systolic function was normal. The estimated ejection fraction was in the range of 60% to 65%. Wall motion was normal; there were no regional wall motion abnormalities. Doppler parameters are consistent with abnormal left ventricular relaxation (grade 1 diastolic dysfunction). - Aortic valve: Valve mobility was restricted. There was moderate stenosis. There was mild regurgitation. Peak velocity (S): 362 cm/s. Mean gradient (S): 29 mm Hg.  DVT Prophylaxis  :  Lovenox    Lab Results  Component Value Date   PLT 198 12/26/2016    Inpatient Medications  Scheduled Meds: . atorvastatin  10  mg Oral QHS  . memantine  28 mg Oral Daily   And  . donepezil  10 mg Oral Daily  . enoxaparin (LOVENOX) injection  40 mg Subcutaneous Q24H  . fluticasone  1 spray Each Nare Daily  . isosorbide mononitrate  30 mg Oral Daily  . ketoconazole   Topical BID  . mometasone-formoterol  2 puff Inhalation BID  . triamcinolone   Topical BID   Continuous Infusions:  PRN Meds:.[DISCONTINUED] acetaminophen **OR** acetaminophen (TYLENOL) oral liquid 160 mg/5 mL **OR** [DISCONTINUED] acetaminophen, albuterol, haloperidol lactate, meclizine, senna-docusate  Antibiotics  :    Anti-infectives    None         Objective:   Vitals:   12/29/16 0130 12/29/16 0419 12/29/16 0530 12/29/16 0803  BP: (!) 142/59 (!) 151/59 (!) 151/59   Pulse: (!) 55 (!) 56 (!) 56   Resp: 19 18 18    Temp: 98 F (36.7 C) 97.7 F (36.5 C) 97.7 F (36.5 C)   TempSrc: Oral Oral Oral   SpO2:  97%  97%  Weight:      Height:        Wt Readings from Last 3 Encounters:  12/26/16 79.4 kg (175 lb)     Intake/Output Summary (Last 24 hours) at 12/29/16 1042 Last data filed at 12/29/16 0900  Gross per 24 hour  Intake             1200 ml  Output                0 ml  Net             1200 ml     Physical Exam  Awake Alert, Oriented X 2, No new F.N deficits, Normal affect [place].AT,PERRAL Supple Neck,No JVD, No cervical lymphadenopathy appriciated.  Symmetrical Chest wall movement, Good air movement  bilaterally, CTAB RRR,No Gallops,Rubs or new Murmurs, No Parasternal Heave +ve B.Sounds, Abd Soft, No tenderness, No organomegaly appriciated, No rebound - guarding or rigidity. No Cyanosis, Clubbing or edema, No new Rash or bruise      Data Review:    CBC  Recent Labs Lab 12/26/16 1318  WBC 7.1  HGB 16.0  HCT 46.9  PLT 198  MCV 98.7  MCH 33.7  MCHC 34.1  RDW 13.1  LYMPHSABS 1.5  MONOABS 0.9  EOSABS 0.1  BASOSABS 0.0    Chemistries   Recent Labs Lab 12/26/16 1318 12/28/16 0645 12/29/16 0648  NA  134* 138  --   K 4.0 3.4* 3.6  CL 101 110  --   CO2 27 24  --   GLUCOSE 83 88  --   BUN 23* 14  --   CREATININE 1.03 0.88  --   CALCIUM 9.2 8.1*  --   AST 24  --   --   ALT 14*  --   --   ALKPHOS 71  --   --   BILITOT 1.3*  --   --    ------------------------------------------------------------------------------------------------------------------  Recent Labs  12/27/16 0701  CHOL 162  HDL 36*  LDLCALC 110*  TRIG 79  CHOLHDL 4.5    Lab Results  Component Value Date   HGBA1C 5.0 12/27/2016   ------------------------------------------------------------------------------------------------------------------  Recent Labs  12/26/16 2110  TSH 1.673   ------------------------------------------------------------------------------------------------------------------ No results for input(s): VITAMINB12, FOLATE, FERRITIN, TIBC, IRON, RETICCTPCT in the last 72 hours.  Coagulation profile  Recent Labs Lab 12/26/16 1318  INR 0.99    No results for input(s): DDIMER in the last 72 hours.  Cardiac Enzymes  Recent Labs Lab 12/26/16 1318  TROPONINI <0.03   ------------------------------------------------------------------------------------------------------------------ No results found for: BNP  Micro Results No results found for this or any previous visit (from the past 240 hour(s)).  Radiology Reports Ct Head Wo Contrast  Result Date: 12/26/2016 CLINICAL DATA:  Worsening of dizziness, lightheadedness over the last 2 days, some nausea EXAM: CT HEAD WITHOUT CONTRAST TECHNIQUE: Contiguous axial images were obtained from the base of the skull through the vertex without intravenous contrast. COMPARISON:  None. FINDINGS: Brain: The ventricular system is prominent as are the cortical sulci, consistent with diffuse atrophy. The septum is midline in position. The fourth ventricle and basilar cisterns are unremarkable. Moderate small vessel ischemic change is noted throughout  the periventricular white matter. No hemorrhage, mass lesion, or acute infarction is seen. Vascular: No vascular abnormality is seen on this unenhanced study. Skull: No calvarial lesion is seen. Sinuses/Orbits: The paranasal sinuses are pneumatized. The maxillary sinuses are not visualized. Other: None IMPRESSION: Moderate small vessel ischemic change. Atrophy. No acute intracranial abnormality. Electronically Signed   By: Ivar Drape M.D.   On: 12/26/2016 14:17   Mr Brain Wo Contrast  Result Date: 12/27/2016 CLINICAL DATA:  76 year old male with dizziness, confusion, slurred speech, near syncope. Initial encounter. EXAM: MRI HEAD WITHOUT CONTRAST TECHNIQUE: Multiplanar, multiecho pulse sequences of the brain and surrounding structures were obtained without intravenous contrast. COMPARISON:  Head CT without contrast 12/26/2016. FINDINGS: Brain: No restricted diffusion to suggest acute infarction. No midline shift, mass effect, evidence of mass lesion, ventriculomegaly, extra-axial collection or acute intracranial hemorrhage. Cervicomedullary junction and pituitary are within normal limits. Scattered and patchy bilateral cerebral white matter T2 and FLAIR hyperintensity, in a nonspecific configuration. No cortical encephalomalacia or chronic cerebral blood products identified. Nonspecific T2 heterogeneity in the right globus pallidus. Other  deep gray matter nuclei, the brainstem, and the cerebellum are normal for age. Vascular: Major intracranial vascular flow voids are preserved. Skull and upper cervical spine: Negative. Normal bone marrow signal. Sinuses/Orbits: Postoperative changes to both globes. Otherwise negative orbits soft tissues. Trace paranasal sinus mucosal thickening. Other: Visible internal auditory structures appear normal. Mastoids are clear. Negative scalp soft tissues. IMPRESSION: 1.  No acute intracranial abnormality. 2. Mild to moderate for age nonspecific cerebral white matter signal  changes, most commonly due to chronic small vessel disease. Electronically Signed   By: Genevie Ann M.D.   On: 12/27/2016 09:59   Mr Jodene Nam Head/brain [id number] Cm  Result Date: 12/27/2016 CLINICAL DATA:  76 year old male with dizziness, confusion, slurred speech, near syncope. Initial encounter. EXAM: MRA HEAD WITHOUT CONTRAST TECHNIQUE: Angiographic images of the Circle of Willis were obtained using MRA technique without intravenous contrast. COMPARISON:  Brain MRI today reported separately. FINDINGS: Antegrade flow in the posterior circulation with dominant distal left vertebral artery. Mild vertebral tortuosity without stenosis. Patent PICA origins and vertebrobasilar junction. Mild basilar tortuosity and irregularity without stenosis. SCA and PCA origins are normal. Posterior communicating arteries are diminutive or absent. Bilateral PCA branches are within normal limits. Antegrade flow in both ICA siphons. Severe tortuosity of both distal cervical ICAs. Mild to moderate siphon irregularity in keeping with atherosclerosis. No hemodynamically significant siphon stenosis. Ophthalmic artery origins, carotid termini, MCA and ACA origins are normal. Mildly dominant left ACA A1 segment. Anterior communicating artery and visualized ACA branches are within normal limits. Mild bilateral left MCA M1 segment irregularity and stenosis. Both MCA bifurcations are patent. No MCA branch occlusion identified. 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 Electronically Signed   By: Genevie Ann M.D.   On: 12/27/2016 10:02    Time Spent in minutes  30   SINGH,PRASHANT K M.D on 12/29/2016 at 10:42 AM  Between 7am to 7pm - Pager - [phone]  After 7pm go to [email or web address] - password [facility]  [facility] -  Office  [phone]

## 2016-12-29 NOTE — Clinical Social Work Note (Signed)
Clinical Social Work Assessment  Patient Details  Name: David Richmond MRN: [id number] Date of Birth: 1941-05-04  Date of referral:  12/29/16               Reason for consult:  Discharge Planning                Permission sought to share information with:  Other (Friend, David Richmond) Permission granted to share information::  Yes, Verbal Permission Granted  Name::     David Richmond  Agency::     Relationship::  friend  Contact Information:     Housing/Transportation Living arrangements for the past 2 months:  Apartment Source of Information:  Patient, Friend/Neighbor Patient Interpreter Needed:  None Criminal Activity/Legal Involvement Pertinent to Current Situation/Hospitalization:  No - Comment as needed Significant Relationships:  Other(Comment) (Friends) Lives with:  Self Do you feel safe going back to the place where you live?  Yes (pt feels safe returning home) Need for family participation in patient care:  Yes (Comment)  Care giving concerns:  Pt lives alone. He has dementia and per MD, lacks capacity and is not safe to return home alone.    Social Worker assessment / plan:  CSW met with pt and pt's friend, David Richmond. Pt states that David Richmond and his brother are his best friends and are like family to him. They do not have POA, but pt requests that if necessary they make decisions for him. Pt is oriented to self and place and also very clear regarding discussion about placement and home situation. However, he is confused to time and reason for hospital stay. Pt has not seen or spoken to any family in years. His sister passed away a few years ago and was the only one he communicated with. CSW shared reason for visit and that MD requests pt consider placement for safety. David Richmond reports that he thinks it is "borderline" whether pt is safe at home, particularly due to steps at apartment. Pt is still driving and enjoys his independence. PT evaluated pt and recommend home health and supervision. David Richmond states his  brother David Richmond brought pt to hospital because when he visited him at home he was disoriented and weak. David Richmond and David Richmond own a shop in [place] and pt often drives to see them there during the week, but if not one of them check on pt daily. CSW discussed ALF as pt does not have skilled need. Pt is frustrated with idea of leaving his home, but also appears to value his friend's opinion. They plan to discuss in more detail together today, but agree for CSW to send referral to [facility] only at this time.   Employment status:  Retired Forensic scientist:  Information systems manager, Medicaid In [place] PT Recommendations:  Home with [place], [place] / Referral to community resources:  Other (Comment Required) (ALF list)  Patient/Family's Response to care:  Pt is very clear regarding discussion of ALF. He is very hesitant and states, "My independence is all I have." David Richmond agrees to help with decision making and they ask CSW to send referral to [facility] in [place] as pt would be most agreeable to this facility. Support provided.   Patient/Family's Understanding of and Emotional Response to Diagnosis, Current Treatment, and Prognosis:  Pt is unsure of admission reason or how he got to hospital.   Emotional Assessment Appearance:  Appears stated age Attitude/Demeanor/Rapport:  Other (Pleasant) Affect (typically observed):  Frustrated Orientation:  Oriented to Self, Oriented to Place, Fluctuating Orientation (  Suspected and/or reported Sundowners) Alcohol / Substance use:  Not Applicable Psych involvement (Current and /or in the community):  No (Comment)  Discharge Needs  Concerns to be addressed:  Discharge Planning Concerns Readmission within the last 30 days:  No Current discharge risk:  Lives alone Barriers to Discharge:  Continued Medical Work up   [employer], [employer], [place] 12/29/2016, 3:20 PM [phone]

## 2016-12-29 NOTE — Evaluation (Signed)
Occupational Therapy Evaluation Patient Details Name: David Richmond MRN: [id number] DOB: [birth year] Today's Date: 12/29/2016    History of Present Illness David Richmond is a 76 y.o. male with medical history significant for hypertension, hyperlipidemia, and dementia who presents to the emergency department for evaluation of confusion and slurred speech. Patient is brought in by a friend who is concerned that for the past 3 days, the patient has seemed quite confused and is slurring his words. Patient is said to have known dementia with at least 1 year of slowly progressive confusion, but there has been an acute change noted over the past 3 days where the patient is no longer able to hold a conversation, stopping midsentence, either unable to find the word he was looking for, or forgetting what he was talking about. There has been no known recent trauma or head injury and no recent change in his medications. Patient is said to be a non-drinker and is not known to use illicit substances. Patient denies any pain, and specifically denies headache, chest pain, or abdominal pain. Patient lives alone with a friend that checks on him periodically.    Clinical Impression   Patient appears to be at baseline although with him living alone I recommend a Speech consult to assess his cognition and safety. He was slow to recall with his long term memory and slightly impulsive. He does not appear to require any follow up OT services at this time.     Follow Up Recommendations  Supervision - Intermittent    Equipment Recommendations       Recommendations for Other Services Speech consult     Precautions / Restrictions Precautions Precautions: Fall Restrictions Weight Bearing Restrictions: No      Mobility Bed Mobility Overal bed mobility: Independent                Transfers Overall transfer level: Independent Equipment used: None                       ADL Overall ADL's : At  baseline                                             Vision Vision Assessment?: No apparent visual deficits          Pertinent Vitals/Pain Pain Assessment: No/denies pain     Hand Dominance Right   Extremity/Trunk Assessment Upper Extremity Assessment Upper Extremity Assessment: Overall WFL for tasks assessed   Lower Extremity Assessment Lower Extremity Assessment: Defer to PT evaluation       Communication Communication Communication: No difficulties   Cognition Arousal/Alertness: Awake/alert Behavior During Therapy: WFL for tasks assessed/performed Overall Cognitive Status: History of cognitive impairments - at baseline Area of Impairment:  (slow recall)                              Home Living Family/patient expects to be discharged to:: Private residence Living Arrangements: Alone Available Help at Discharge: Other (Comment) (Pt states that friends check on him every day. He is alone for long periods of time.) Type of Home: House Home Access: Stairs to enter                     Home Equipment: Environmental consultant - 2 wheels;Cane - single point  Prior Functioning/Environment Level of Independence: Independent        Comments: patient lived by himself with friends intermittently checking in on him. Pt states that he completes all bathing, dressing, and meal prep independently. patient continues to drive if needed.                             Co-evaluation PT/OT/SLP Co-Evaluation/Treatment: Yes Reason for Co-Treatment: To address functional/ADL transfers   OT goals addressed during session: ADL's and self-care;Strengthening/ROM      End of Session Equipment Utilized During Treatment: Gait belt  Activity Tolerance: Patient tolerated treatment well Patient left: Other (comment) (PTA)   Time: 1010-1023 OT Time Calculation (min): 13 min Charges:  OT General Charges $OT Visit: 1 Procedure OT Evaluation $OT  Eval Low Complexity: 1 Procedure G-Codes:    Eveleen Mcnear, Clarene Duke 12/29/2016, 10:46 AM  Ailene Ravel, OTR/L,CBIS  [phone]

## 2016-12-29 NOTE — NC FL2 (Signed)
[place] LEVEL OF CARE SCREENING TOOL     IDENTIFICATION  Patient Name: David Richmond Birthdate: [birth year] Sex: male Admission Date (Current Location): 12/26/2016  [place] and [place] Number:  [place] [id number] [place] and Address:  [facility] [address] [place]      Provider Number: [id number]  Attending Physician Name and Address:  Thurnell Lose, MD  Relative Name and Phone Number:       Current Level of Care: Hospital Recommended Level of Care: [place] Prior Approval Number:    Date Approved/Denied:   PASRR Number: [id number]  Discharge Plan: Other (Comment) (ALF)    Current Diagnoses: Patient Active Problem List   Diagnosis Date Noted  . Dementia 12/26/2016  . Acute encephalopathy 12/26/2016  . Sinus bradycardia 12/26/2016  . Orthostasis 12/26/2016  . Expressive aphasia 12/26/2016  . Dysarthria 12/26/2016  . Seborrhea 12/26/2016  . Bradycardia     Orientation RESPIRATION BLADDER Height & Weight     Self, Place  Normal Continent Weight: 175 lb (79.4 kg) Height:  5\' 4"  (162.6 cm)  BEHAVIORAL SYMPTOMS/MOOD NEUROLOGICAL BOWEL NUTRITION STATUS  Other (Comment) (none)  (n/a) Continent Diet (Heart healthy)  AMBULATORY STATUS COMMUNICATION OF NEEDS Skin   Limited Assist Verbally Other (Comment) (Rash to head)                       Personal Care Assistance Level of Assistance  Bathing, Feeding, Dressing Bathing Assistance: Limited assistance Feeding assistance: Limited assistance Dressing Assistance: Limited assistance     Functional Limitations Info  Sight, Hearing, Speech Sight Info: Adequate Hearing Info: Adequate Speech Info: Adequate    SPECIAL CARE FACTORS FREQUENCY  PT (By licensed PT)     PT Frequency: home health              Contractures Contractures Info: Not present    Additional Factors Info  Psychotropic Code Status Info: Full code Allergies Info: No  known allergies Psychotropic Info: Haldol PRN         Current Medications (12/29/2016):  This is the current hospital active medication list Current Facility-Administered Medications  Medication Dose Route Frequency Provider Last Rate Last Dose  . acetaminophen (TYLENOL) solution 650 mg  650 mg Per Tube Q4H PRN Ilene Qua Opyd, MD      . albuterol (PROVENTIL) (2.5 MG/3ML) 0.083% nebulizer solution 3 mL  3 mL Inhalation Q6H PRN Ilene Qua Opyd, MD      . atorvastatin (LIPITOR) tablet 10 mg  10 mg Oral QHS Vianne Bulls, MD   10 mg at 12/28/16 2312  . memantine (NAMENDA XR) 24 hr capsule 28 mg  28 mg Oral Daily Thurnell Lose, MD   28 mg at 12/29/16 1043   And  . donepezil (ARICEPT) tablet 10 mg  10 mg Oral Daily Thurnell Lose, MD   10 mg at 12/29/16 1043  . enoxaparin (LOVENOX) injection 40 mg  40 mg Subcutaneous Q24H Vianne Bulls, MD   40 mg at 12/28/16 2313  . fluticasone (FLONASE) 50 MCG/ACT nasal spray 1 spray  1 spray Each Nare Daily Vianne Bulls, MD   1 spray at 12/29/16 1043  . haloperidol lactate (HALDOL) injection 2 mg  2 mg Intravenous Q6H PRN Thurnell Lose, MD   2 mg at 12/27/16 1755  . isosorbide mononitrate (IMDUR) 24 hr tablet 30 mg  30 mg Oral Daily Vianne Bulls, MD  30 mg at 12/29/16 1043  . ketoconazole (NIZORAL) 2 % cream   Topical BID Vianne Bulls, MD      . meclizine (ANTIVERT) tablet 25 mg  25 mg Oral TID PRN Vianne Bulls, MD      . mometasone-formoterol (DULERA) 200-5 MCG/ACT inhaler 2 puff  2 puff Inhalation BID Vianne Bulls, MD   2 puff at 12/29/16 0802  . senna-docusate (Senokot-S) tablet 1 tablet  1 tablet Oral QHS PRN Ilene Qua Opyd, MD      . triamcinolone (KENALOG) 0.025 % cream   Topical BID Vianne Bulls, MD      . tuberculin injection 5 Units  5 Units Intradermal Once Thurnell Lose, MD         Discharge Medications: Please see discharge summary for a list of discharge medications.  Relevant Imaging Results:  Relevant Lab  Results:   Additional Information TB skin test administered 12/29/16.   David Richmond, [place]

## 2016-12-30 DIAGNOSIS — G9340 Encephalopathy, unspecified: Secondary | ICD-10-CM | POA: Diagnosis not present

## 2016-12-30 DIAGNOSIS — R4182 Altered mental status, unspecified: Secondary | ICD-10-CM | POA: Diagnosis not present

## 2016-12-30 MED ORDER — HYDRALAZINE HCL 25 MG PO TABS: 25.0000 mg | ORAL_TABLET | Freq: Three times a day (TID) | ORAL | 0 refills | Status: AC

## 2016-12-30 MED ORDER — HYDRALAZINE HCL 25 MG PO TABS
25.0000 mg | ORAL_TABLET | Freq: Three times a day (TID) | ORAL | Status: DC
  Administered 2016-12-30: 25 mg via ORAL
  Filled 2016-12-30: qty 1

## 2016-12-30 NOTE — Clinical Social Work Note (Addendum)
Pt continues to be very resistant regarding placement at ALF. CSW spoke with MD again today who feels primary concern is about pt driving. [facility] unable to accept pt and this was pt and friend's only facility request. They are not interested in any other facilities at this time. Pt's friend, [name] updated and states he would like for pt to return home and they will provide increased supervision. [name] feels that pt has improved and is close to baseline. CSW discussed concern with [name] about pt driving and agrees to address with pt. He also plans to make appointment with PCP to discuss as pt respects Dr. Sherrie Sport. Updated MD who will plan to d/c to home today with understanding that pt should not drive.  Recommendation for home health and [name] requests this service. CM notified.   Update: CSW reviewed d/c summary and clarified with MD that CSW cannot make determination regarding home safety, however patient and his friends choose for him to go home rather than placement and concern about driving was clearly discussed.  Benay Pike, [place]

## 2016-12-30 NOTE — Care Management Important Message (Signed)
Important Message  Patient Details  Name: David Richmond MRN: [id number] Date of Birth: 06/17/1941   Medicare Important Message Given:  Yes    Merida Alcantar, Chauncey Reading, RN 12/30/2016, 4:18 PM

## 2016-12-30 NOTE — Progress Notes (Signed)
Patient discharged home with instructions given on medications,and follow up visits, patient verbalized understanding. Prescription sent with patient. Accompanied by staff to an awaiting vehicle.

## 2016-12-30 NOTE — Discharge Instructions (Signed)
Follow with Primary MD Neale Burly, MD in 7 days . Do not drive unless you are cleared by your Primary MD.  Get CBC, CMP, 2 view Chest X ray checked  by Primary MD or SNF MD in 5-7 days ( we routinely change or add medications that can affect your baseline labs and fluid status, therefore we recommend that you get the mentioned basic workup next visit with your PCP, your PCP may decide not to get them or add new tests based on their clinical decision)   Activity: As tolerated with Full fall precautions use walker/cane & assistance as needed   Disposition Home    Diet:  Heart Healthy   For Heart failure patients - Check your Weight same time everyday, if you gain over 2 pounds, or you develop in leg swelling, experience more shortness of breath or chest pain, call your Primary MD immediately. Follow Cardiac Low Salt Diet and 1.5 lit/day fluid restriction.   On your next visit with your primary care physician please Get Medicines reviewed and adjusted.   Please request your Prim.MD to go over all Hospital Tests and Procedure/Radiological results at the follow up, please get all Hospital records sent to your Prim MD by signing hospital release before you go home.   If you experience worsening of your admission symptoms, develop shortness of breath, life threatening emergency, suicidal or homicidal thoughts you must seek medical attention immediately by calling 911 or calling your MD immediately  if symptoms less severe.  You Must read complete instructions/literature along with all the possible adverse reactions/side effects for all the Medicines you take and that have been prescribed to you. Take any new Medicines after you have completely understood and accpet all the possible adverse reactions/side effects.   Do not drive, operate heavy machinery, perform activities at heights, swimming or participation in water activities or provide baby sitting services if your were admitted for  syncope or siezures until you have seen by Primary MD or a Neurologist and advised to do so again.  Do not drive when taking Pain medications.    Do not take more than prescribed Pain, Sleep and Anxiety Medications  Special Instructions: If you have smoked or chewed Tobacco  in the last 2 yrs please stop smoking, stop any regular Alcohol  and or any Recreational drug use.  Wear Seat belts while driving.   Please note  You were cared for by a hospitalist during your hospital stay. If you have any questions about your discharge medications or the care you received while you were in the hospital after you are discharged, you can call the unit and asked to speak with the hospitalist on call if the hospitalist that took care of you is not available. Once you are discharged, your primary care physician will handle any further medical issues. Please note that NO REFILLS for any discharge medications will be authorized once you are discharged, as it is imperative that you return to your primary care physician (or establish a relationship with a primary care physician if you do not have one) for your aftercare needs so that they can reassess your need for medications and monitor your lab values.

## 2016-12-30 NOTE — Consult Note (Signed)
[place] A. Merlene Laughter, MD     [email or web address]          David Richmond is an 76 y.o. male.   ASSESSMENT/PLAN: Baseline dementia which appears her progress. He symptoms had worsening of symptoms the last few days but never returned to baseline. Consider EEG if the patient has another episode of more confusion over his baseline confusion. Also consider repeat dementia labs. I will continue with the current medications used to treat his dementia.   The patient presents with a 3 day history of dysarthria and altered mental status. He does have a baseline history of dementia for at least a year. There is not much family around to get a history. He apparently has 2 male relatives which function as his children. The patient's history is not reliable. It appears that he has been on medication for dementia over the last year. He was taken to the hospital because of worsening symptoms in the last 3 days associated with dysarthria. The patient lives by himself. It appears that since being hospitalized in may of returned to baseline. He again cannot provide a clear history. No clear episodes of focal numbness or weakness or convulsions. The patient restates that he has done better since hospitalized but his history is vague and inconsistent and unreliable.    GENERAL: He is in no acute distress.  HEENT: Supple. Atraumatic normocephalic.   ABDOMEN: soft  EXTREMITIES: No edema   BACK: Normal.  SKIN: Normal by inspection.    MENTAL STATUS: He is awake and alert. He knows that he is in the hospital. He is oriented to year. He can give little insight as to why he is in the hospital and into his overall medical condition. Speech is fine at times but at times he appears nonsensical.  CRANIAL NERVES: Pupils are equal, round and reactive to light and accommodation; extra ocular movements are full, there is no significant nystagmus; visual fields are full; upper and lower facial muscles  are normal in strength and symmetric, there is no flattening of the nasolabial folds; tongue is midline; uvula is midline; shoulder elevation is normal.  MOTOR: Normal tone, bulk and strength; no pronator drift.  COORDINATION: Left finger to nose is normal, right finger to nose is normal, No rest tremor; no intention tremor; no postural tremor; no bradykinesia.  REFLEXES: Deep tendon reflexes are symmetrical and normal. Babinski reflexes are flexor bilaterally.   SENSATION: Normal to light touch.   Blood pressure (!) 164/60, pulse (!) 49, temperature 97.4 F (36.3 C), temperature source Oral, resp. rate 18, height 5\' 4"  (1.626 m), weight 175 lb (79.4 kg), SpO2 93 %.  Past Medical History:  Diagnosis Date  . Hypertension     History reviewed. No pertinent surgical history.  Family History  Problem Relation Age of Onset  . Heart disease Other     Social History:  reports that he has quit smoking. He has never used smokeless tobacco. He reports that he does not drink alcohol or use drugs.  Allergies: No Known Allergies  Medications: Prior to Admission medications   Medication Sig Start Date End Date Taking? Authorizing Provider  albuterol (PROVENTIL HFA;VENTOLIN HFA) 108 (90 Base) MCG/ACT inhaler Inhale 1-2 puffs into the lungs every 6 (six) hours as needed for wheezing or shortness of breath.   Yes Historical Provider, MD  atorvastatin (LIPITOR) 10 MG tablet Take 10 mg by mouth at bedtime.    Yes Historical Provider, MD  budesonide-formoterol (SYMBICORT) 160-4.5 MCG/ACT  inhaler Inhale 2 puffs into the lungs 2 (two) times daily.   Yes Historical Provider, MD  fluticasone (FLONASE) 50 MCG/ACT nasal spray Place 1 spray into both nostrils daily.   Yes Historical Provider, MD  isosorbide mononitrate (IMDUR) 30 MG 24 hr tablet Take 30 mg by mouth daily.   Yes Historical Provider, MD  meclizine (ANTIVERT) 25 MG tablet Take 25 mg by mouth 3 (three) times daily as needed for dizziness.    Yes Historical Provider, MD  Memantine HCl-Donepezil HCl (NAMZARIC) 28-10 MG CP24 Take 1 capsule by mouth daily.   Yes Historical Provider, MD    Scheduled Meds: . atorvastatin  10 mg Oral QHS  . memantine  28 mg Oral Daily   And  . donepezil  10 mg Oral Daily  . enoxaparin (LOVENOX) injection  40 mg Subcutaneous Q24H  . fluticasone  1 spray Each Nare Daily  . hydrALAZINE  25 mg Oral Q8H  . isosorbide mononitrate  30 mg Oral Daily  . ketoconazole   Topical BID  . mometasone-formoterol  2 puff Inhalation BID  . triamcinolone   Topical BID  . tuberculin  5 Units Intradermal Once   Continuous Infusions: PRN Meds:.[DISCONTINUED] acetaminophen **OR** acetaminophen (TYLENOL) oral liquid 160 mg/5 mL **OR** [DISCONTINUED] acetaminophen, albuterol, haloperidol lactate, meclizine, senna-docusate     Results for orders placed or performed during the hospital encounter of 12/26/16 (from the past 48 hour(s))  Potassium     Status: None   Collection Time: 12/29/16  6:48 AM  Result Value Ref Range   Potassium 3.6 3.5 - 5.1 mmol/L    Studies/Results:  BRAIN MRA FINDINGS: Antegrade flow in the posterior circulation with dominant distal left vertebral artery. Mild vertebral tortuosity without stenosis. Patent PICA origins and vertebrobasilar junction. Mild basilar tortuosity and irregularity without stenosis. SCA and PCA origins are normal. Posterior communicating arteries are diminutive or absent. Bilateral PCA branches are within normal limits.  Antegrade flow in both ICA siphons. Severe tortuosity of both distal cervical ICAs. Mild to moderate siphon irregularity in keeping with atherosclerosis. No hemodynamically significant siphon stenosis. Ophthalmic artery origins, carotid termini, MCA and ACA origins are normal. Mildly dominant left ACA A1 segment. Anterior communicating artery and visualized ACA branches are within normal limits. Mild bilateral left MCA M1 segment irregularity  and stenosis. Both MCA bifurcations are patent. No MCA branch occlusion identified.  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      BRAIN MRI FINDINGS: Brain: No restricted diffusion to suggest acute infarction. No midline shift, mass effect, evidence of mass lesion, ventriculomegaly, extra-axial collection or acute intracranial hemorrhage. Cervicomedullary junction and pituitary are within normal limits.  Scattered and patchy bilateral cerebral white matter T2 and FLAIR hyperintensity, in a nonspecific configuration. No cortical encephalomalacia or chronic cerebral blood products identified. Nonspecific T2 heterogeneity in the right globus pallidus. Other deep gray matter nuclei, the brainstem, and the cerebellum are normal for age.  Vascular: Major intracranial vascular flow voids are preserved.  Skull and upper cervical spine: Negative. Normal bone marrow signal.  Sinuses/Orbits: Postoperative changes to both globes. Otherwise negative orbits soft tissues. Trace paranasal sinus mucosal thickening.  Other: Visible internal auditory structures appear normal. Mastoids are clear. Negative scalp soft tissues.  IMPRESSION: 1.  No acute intracranial abnormality. 2. Mild to moderate for age nonspecific cerebral white matter signal changes, most commonly due to chronic small vessel disease.  The brain MRI scan is reviewed in person. There is marked global atrophy. 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 No acute changes are seen on DWI. No microhemorrhages are appreciated.   Veyda Kaufman A. Merlene Laughter, M.D.  Diplomate, Tax adviser of Psychiatry and Neurology ( Neurology). 12/30/2016, 8:28 AM

## 2016-12-30 NOTE — Care Management Note (Addendum)
Case Management Note  Patient Details  Name: Dansby Blase MRN: [id number] Date of Birth: [birth year]  Subjective/Objective:                  Patient adm for stroke workup, negative thus far. LIves alone, has friend who checks on him. Patient appears to have dementia and needs ALF placement.   Action/Plan:CSW working with patient on possible placement to ALF. CM following.   Later entry: Patient will not be eligible for ALF placement today. HH ordered and [name] (friend) will transport patient home today. Offered choice of HH.  Romualdo Bolk of [facility] notified and will obtain orders from chart. Patient/friend aware [facility] has 48 hours to initiate services.  Expected Discharge Date:       12/30/2016     Expected Discharge Plan:  Assisted Living / Rest Home  In-House Referral:  Clinical Social Work  Discharge planning Services  CM Consult  Post Acute Care Choice:  NA Choice offered to:  NA  DME Arranged:    DME Agency:     HH Arranged:    HH Agency:     Status of Service:  In process, will continue to follow  If discussed at Long Length of Stay Meetings, dates discussed:    Additional Comments:  [place] Noori, Chauncey Reading, RN 12/30/2016, 12:47 PM

## 2016-12-30 NOTE — Progress Notes (Addendum)
PROGRESS NOTE                                                                                                                                                                                                             Patient Demographics:    David Richmond, is a 76 y.o. male, DOB - 16-Dec-1941, [id number]  Admit date - 12/26/2016   Admitting Physician Vianne Bulls, MD  Outpatient Primary MD for the patient is Neale Burly, MD  LOS - 3  Chief Complaint  Patient presents with  . Altered Mental Status       Brief Narrative     David Richmond is a 76 y.o. male with medical history significant for hypertension, hyperlipidemia, and dementia who presents to the emergency department for evaluation of confusion and slurred speech. Patient is brought in by a friend who is concerned that for the past 3 days, the patient has seemed quite confused and is slurring his words. Patient is said to have known dementia with at least 1 year of slowly progressive confusion, but there has been an acute change noted over the past 3 days where the patient is no longer able to hold a conversation, stopping midsentence, either unable to find the word he was looking for, or forgetting what he was talking about.   There has been no known recent trauma or head injury and no recent change in his medications. Patient is said to be a non-drinker and is not known to use illicit substances. Patient denies any pain, and specifically denies headache, chest pain, or abdominal pain. Patient lives alone with a friend that checks on him periodically.    Subjective:    [name] today has, No headache, No chest pain, No abdominal pain - No Nausea, No new weakness tingling or numbness, No Cough - SOB.     Assessment  & Plan :    1. Questionable expressive aphasia/dysarthria. I think he has gradually progressive dementia with extreme weakness and confusion due to  severe orthostatic hypotension.    Head CT and MRI brain nonacute, he has no focal deficits, he is oriented 2, does not appear to have dysarthria or aphasia at this point. By speech and clinically appears to be close to baseline. Discussed his case with neurologist on call Dr. 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  At this time medically he is stable however I'm concerned that he is not safe to be discharged home as he lives alone and is still driving. I'm requesting his primary care physician to look into taking away his driving privileges or else he had very high risk for a motor vehicle accident or getting lost.  Social work is requested to look into placement for safe discharge.    2. Orthostatic hypotension. Most likely due to dehydration. Received IV fluids, and stockings, resolved with hydration and stockings.  3. Underlying dementia. Continue home regimen. Plan as above.  4. Dyslipidemia. Continue statin.  5. Sinus bradycardia. With good supine blood pressures, good chronotropic response, TSH stable on no nodal blocking agents. TTE nonacute.  6. Moderate aortic stenosis. Avoid preload reduction, outpatient cardiology follow-up.    Family Communication  :  None  Code Status :  Full  Diet : Diet Heart Room service appropriate? Yes; Fluid consistency: Thin    Disposition Plan  :  SNF  Consults  :  None  Procedures  :    MRI Brain - Non acute  TTE - - Left ventricle: The cavity size was normal. Systolic function was normal. The estimated ejection fraction was in the range of 60% to 65%. Wall motion was normal; there were no regional wall motion abnormalities. Doppler parameters are consistent with abnormal left ventricular relaxation (grade 1 diastolic dysfunction). - Aortic valve: Valve mobility was restricted. There was moderate stenosis. There was mild regurgitation. Peak velocity (S): 362 cm/s. Mean gradient (S): 29 mm Hg.  DVT  Prophylaxis  :  Lovenox    Lab Results  Component Value Date   PLT 198 12/26/2016    Inpatient Medications  Scheduled Meds: . atorvastatin  10 mg Oral QHS  . memantine  28 mg Oral Daily   And  . donepezil  10 mg Oral Daily  . enoxaparin (LOVENOX) injection  40 mg Subcutaneous Q24H  . fluticasone  1 spray Each Nare Daily  . hydrALAZINE  25 mg Oral Q8H  . isosorbide mononitrate  30 mg Oral Daily  . ketoconazole   Topical BID  . mometasone-formoterol  2 puff Inhalation BID  . triamcinolone   Topical BID  . tuberculin  5 Units Intradermal Once   Continuous Infusions:  PRN Meds:.[DISCONTINUED] acetaminophen **OR** acetaminophen (TYLENOL) oral liquid 160 mg/5 mL **OR** [DISCONTINUED] acetaminophen, albuterol, haloperidol lactate, meclizine, senna-docusate  Antibiotics  :    Anti-infectives    None         Objective:   Vitals:   12/29/16 2013 12/29/16 2100 12/30/16 0500 12/30/16 0804  BP:  (!) 144/58 (!) 164/60   Pulse:  61 (!) 49   Resp:  18 18   Temp:  97.9 F (36.6 C) 97.4 F (36.3 C)   TempSrc:  Oral Oral   SpO2: 96% 97% 95% 93%  Weight:      Height:        Wt Readings from Last 3 Encounters:  12/26/16 79.4 kg (175 lb)     Intake/Output Summary (Last 24 hours) at 12/30/16 0954 Last data filed at 12/29/16 1700  Gross per 24 hour  Intake              480 ml  Output                0 ml  Net              480  ml     Physical Exam  Awake , Mildly confused confusion is more intermittent, Oriented X 2, No new F.N deficits, Normal affect [place].AT,PERRAL Supple Neck,No JVD, No cervical lymphadenopathy appriciated.  Symmetrical Chest wall movement, Good air movement bilaterally, CTAB RRR,No Gallops,Rubs or new Murmurs, No Parasternal Heave +ve B.Sounds, Abd Soft, No tenderness, No organomegaly appriciated, No rebound - guarding or rigidity. No Cyanosis, Clubbing or edema, No new Rash or bruise      Data Review:    CBC  Recent Labs Lab 12/26/16 1318    WBC 7.1  HGB 16.0  HCT 46.9  PLT 198  MCV 98.7  MCH 33.7  MCHC 34.1  RDW 13.1  LYMPHSABS 1.5  MONOABS 0.9  EOSABS 0.1  BASOSABS 0.0    Chemistries   Recent Labs Lab 12/26/16 1318 12/28/16 0645 12/29/16 0648  NA 134* 138  --   K 4.0 3.4* 3.6  CL 101 110  --   CO2 27 24  --   GLUCOSE 83 88  --   BUN 23* 14  --   CREATININE 1.03 0.88  --   CALCIUM 9.2 8.1*  --   AST 24  --   --   ALT 14*  --   --   ALKPHOS 71  --   --   BILITOT 1.3*  --   --    ------------------------------------------------------------------------------------------------------------------ No results for input(s): CHOL, HDL, LDLCALC, TRIG, CHOLHDL, LDLDIRECT in the last 72 hours.  Lab Results  Component Value Date   HGBA1C 5.0 12/27/2016   ------------------------------------------------------------------------------------------------------------------ No results for input(s): TSH, T4TOTAL, T3FREE, THYROIDAB in the last 72 hours.  Invalid input(s): FREET3 ------------------------------------------------------------------------------------------------------------------ No results for input(s): VITAMINB12, FOLATE, FERRITIN, TIBC, IRON, RETICCTPCT in the last 72 hours.  Coagulation profile  Recent Labs Lab 12/26/16 1318  INR 0.99    No results for input(s): DDIMER in the last 72 hours.  Cardiac Enzymes  Recent Labs Lab 12/26/16 1318  TROPONINI <0.03   ------------------------------------------------------------------------------------------------------------------ No results found for: BNP  Micro Results No results found for this or any previous visit (from the past 240 hour(s)).  Radiology Reports Ct Head Wo Contrast  Result Date: 12/26/2016 CLINICAL DATA:  Worsening of dizziness, lightheadedness over the last 2 days, some nausea EXAM: CT HEAD WITHOUT CONTRAST TECHNIQUE: Contiguous axial images were obtained from the base of the skull through the vertex without intravenous  contrast. COMPARISON:  None. FINDINGS: Brain: The ventricular system is prominent as are the cortical sulci, consistent with diffuse atrophy. The septum is midline in position. The fourth ventricle and basilar cisterns are unremarkable. Moderate small vessel ischemic change is noted throughout the periventricular white matter. No hemorrhage, mass lesion, or acute infarction is seen. Vascular: No vascular abnormality is seen on this unenhanced study. Skull: No calvarial lesion is seen. Sinuses/Orbits: The paranasal sinuses are pneumatized. The maxillary sinuses are not visualized. Other: None IMPRESSION: Moderate small vessel ischemic change. Atrophy. No acute intracranial abnormality. Electronically Signed   By: Ivar Drape M.D.   On: 12/26/2016 14:17   Mr Brain Wo Contrast  Result Date: 12/27/2016 CLINICAL DATA:  76 year old male with dizziness, confusion, slurred speech, near syncope. Initial encounter. EXAM: MRI HEAD WITHOUT CONTRAST TECHNIQUE: Multiplanar, multiecho pulse sequences of the brain and surrounding structures were obtained without intravenous contrast. COMPARISON:  Head CT without contrast 12/26/2016. FINDINGS: Brain: No restricted diffusion to suggest acute infarction. No midline shift, mass effect, evidence of mass lesion, ventriculomegaly, extra-axial collection or acute intracranial hemorrhage. Cervicomedullary  junction and pituitary are within normal limits. Scattered and patchy bilateral cerebral white matter T2 and FLAIR hyperintensity, in a nonspecific configuration. No cortical encephalomalacia or chronic cerebral blood products identified. Nonspecific T2 heterogeneity in the right globus pallidus. Other deep gray matter nuclei, the brainstem, and the cerebellum are normal for age. Vascular: Major intracranial vascular flow voids are preserved. Skull and upper cervical spine: Negative. Normal bone marrow signal. Sinuses/Orbits: Postoperative changes to both globes. Otherwise negative  orbits soft tissues. Trace paranasal sinus mucosal thickening. Other: Visible internal auditory structures appear normal. Mastoids are clear. Negative scalp soft tissues. IMPRESSION: 1.  No acute intracranial abnormality. 2. Mild to moderate for age nonspecific cerebral white matter signal changes, most commonly due to chronic small vessel disease. Electronically Signed   By: Genevie Ann M.D.   On: 12/27/2016 09:59   Mr Jodene Nam Head/brain [id number] Cm  Result Date: 12/27/2016 CLINICAL DATA:  76 year old male with dizziness, confusion, slurred speech, near syncope. Initial encounter. EXAM: MRA HEAD WITHOUT CONTRAST TECHNIQUE: Angiographic images of the Circle of Willis were obtained using MRA technique without intravenous contrast. COMPARISON:  Brain MRI today reported separately. FINDINGS: Antegrade flow in the posterior circulation with dominant distal left vertebral artery. Mild vertebral tortuosity without stenosis. Patent PICA origins and vertebrobasilar junction. Mild basilar tortuosity and irregularity without stenosis. SCA and PCA origins are normal. Posterior communicating arteries are diminutive or absent. Bilateral PCA branches are within normal limits. Antegrade flow in both ICA siphons. Severe tortuosity of both distal cervical ICAs. Mild to moderate siphon irregularity in keeping with atherosclerosis. No hemodynamically significant siphon stenosis. Ophthalmic artery origins, carotid termini, MCA and ACA origins are normal. Mildly dominant left ACA A1 segment. Anterior communicating artery and visualized ACA branches are within normal limits. Mild bilateral left MCA M1 segment irregularity and stenosis. Both MCA bifurcations are patent. No MCA branch occlusion identified. 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 Electronically Signed   By: Genevie Ann M.D.   On: 12/27/2016  10:02    Time Spent in minutes  30   Starlette Thurow K M.D on 12/30/2016 at 9:54 AM  Between 7am to 7pm - Pager - [phone]  After 7pm go to [email or web address] - password [facility]  [facility] -  Office  [phone]

## 2016-12-30 NOTE — Discharge Summary (Addendum)
David Richmond [id number] DOB: June 11, 1941 DOA: 12/26/2016  PCP: Neale Burly, MD  Admit date: 12/26/2016  Discharge date: 12/30/2016  Admitted From: Home    Disposition:  Home   Recommendations for Outpatient Follow-up:   Follow up with PCP in 1-2 weeks  PCP Please obtain BMP/CBC, 2 view CXR in 1week,  (see Discharge instructions)   PCP Please follow up on the following pending results: None   Home Health: PT,RN,Aide, S-Work   Equipment/Devices: None  Consultations: Neuro Discharge Condition: Fair   CODE STATUS:  Full Diet Recommendation:  Heart Healthy    Chief Complaint  Patient presents with  . Altered Mental Status     Brief history of present illness from the day of admission and additional interim summary     David Richmond a 76 y.o.malewith medical history significant for hypertension, hyperlipidemia, and dementia who presents to the emergency department for evaluation of confusion and slurred speech. Patient is brought in by a friend who is concerned that for the past 3 days, the patient has seemed quite confused and is slurring his words. Patient is said to have known dementia with at least 1 year of slowly progressive confusion, but there has been an acute change noted over the past 3 days where the patient is no longer able to hold a conversation, stopping midsentence, either unable to find the word he was looking for, or forgetting what he was talking about.   There has been no known recent trauma or head injury and no recent change in his medications. Patient is said to be a non-drinker and is not known to use illicit substances. Patient denies any pain, and specifically denies headache, chest pain, or abdominal pain. Patient lives alone with a friend that checks on him periodically.  Hospital  issues addressed      1. Questionable expressive aphasia/dysarthria. I think he has gradually progressive dementia with extreme weakness and confusion due to severe orthostatic hypotension.    Head CT and MRI brain nonacute, he has no focal deficits, he is oriented 2, does not appear to have dysarthria or aphasia at this point. By speech and clinically appears to be close to baseline. Discussed his case with neurologist on call Dr. Merlene Laughter who agrees that patient likely has progressive chronic dementia.  At this time medically he is stable however I was concerned that he is not safe to be discharged home as he lives alone and is still driving. Medically he is cleared for discharge but My concern is he will drive and get into a MVA or get lost, I explained this to S.Work Biochemist, clinical .  Social work was requested to look into placement for safe discharge (in terms of supervision at home and from 10 point he is not driving).    S worker has discussed with close family friends about patient's situation and I was assured by social worker patient is safe to be discharged home under the supervision of neighbors and family friends and they will ensure that he is  not driving.I will request the PCP to reevaluate the driving situation again next visit. If he continues to be forgetful and demented I do not think driving is safe. However this has to be finally confirmed outside the hospital setting once he is back to his familiar settings.  I'm requesting his primary care physician to look into taking away his driving privileges or else he had very high risk for a motor vehicle accident or getting lost.  2. Orthostatic hypotension. Most likely due to dehydration. Received IV fluids, and stockings, resolved with hydration and stockings.  3. Underlying dementia. Continue home regimen. Plan as above.  4. Dyslipidemia. Continue statin.  5. Sinus bradycardia. With good supine blood pressures, good chronotropic  response, TSH stable on no nodal blocking agents. TTE nonacute.  6. Moderate aortic stenosis. Avoid preload reduction, outpatient cardiology follow-up.     Discharge diagnosis     Principal Problem:   Expressive aphasia Active Problems:   Dementia   Acute encephalopathy   Sinus bradycardia   Orthostasis   Dysarthria    Discharge instructions    Discharge Instructions    Diet - low sodium heart healthy    Complete by:  As directed    Discharge instructions    Complete by:  As directed    Follow with Primary MD Neale Burly, MD in 7 days . Do not drive unless you are cleared by your Primary MD.  Get CBC, CMP, 2 view Chest X ray checked  by Primary MD or SNF MD in 5-7 days ( we routinely change or add medications that can affect your baseline labs and fluid status, therefore we recommend that you get the mentioned basic workup next visit with your PCP, your PCP may decide not to get them or add new tests based on their clinical decision)   Activity: As tolerated with Full fall precautions use walker/cane & assistance as needed   Disposition Home    Diet:  Heart Healthy   For Heart failure patients - Check your Weight same time everyday, if you gain over 2 pounds, or you develop in leg swelling, experience more shortness of breath or chest pain, call your Primary MD immediately. Follow Cardiac Low Salt Diet and 1.5 lit/day fluid restriction.   On your next visit with your primary care physician please Get Medicines reviewed and adjusted.   Please request your Prim.MD to go over all Hospital Tests and Procedure/Radiological results at the follow up, please get all Hospital records sent to your Prim MD by signing hospital release before you go home.   If you experience worsening of your admission symptoms, develop shortness of breath, life threatening emergency, suicidal or homicidal thoughts you must seek medical attention immediately by calling 911 or calling your MD  immediately  if symptoms less severe.  You Must read complete instructions/literature along with all the possible adverse reactions/side effects for all the Medicines you take and that have been prescribed to you. Take any new Medicines after you have completely understood and accpet all the possible adverse reactions/side effects.   Do not drive, operate heavy machinery, perform activities at heights, swimming or participation in water activities or provide baby sitting services if your were admitted for syncope or siezures until you have seen by Primary MD or a Neurologist and advised to do so again.  Do not drive when taking Pain medications.    Do not take more than prescribed Pain, Sleep and Anxiety Medications  Special Instructions: If  you have smoked or chewed Tobacco  in the last 2 yrs please stop smoking, stop any regular Alcohol  and or any Recreational drug use.  Wear Seat belts while driving.   Please note  You were cared for by a hospitalist during your hospital stay. If you have any questions about your discharge medications or the care you received while you were in the hospital after you are discharged, you can call the unit and asked to speak with the hospitalist on call if the hospitalist that took care of you is not available. Once you are discharged, your primary care physician will handle any further medical issues. Please note that NO REFILLS for any discharge medications will be authorized once you are discharged, as it is imperative that you return to your primary care physician (or establish a relationship with a primary care physician if you do not have one) for your aftercare needs so that they can reassess your need for medications and monitor your lab values.   Increase activity slowly    Complete by:  As directed       Discharge Medications   Allergies as of 12/30/2016   No Known Allergies     Medication List    TAKE these medications   albuterol 108 (90  Base) MCG/ACT inhaler Commonly known as:  PROVENTIL HFA;VENTOLIN HFA Inhale 1-2 puffs into the lungs every 6 (six) hours as needed for wheezing or shortness of breath.   atorvastatin 10 MG tablet Commonly known as:  LIPITOR Take 10 mg by mouth at bedtime.   budesonide-formoterol 160-4.5 MCG/ACT inhaler Commonly known as:  SYMBICORT Inhale 2 puffs into the lungs 2 (two) times daily.   fluticasone 50 MCG/ACT nasal spray Commonly known as:  FLONASE Place 1 spray into both nostrils daily.   hydrALAZINE 25 MG tablet Commonly known as:  APRESOLINE Take 1 tablet (25 mg total) by mouth every 8 (eight) hours.   isosorbide mononitrate 30 MG 24 hr tablet Commonly known as:  IMDUR Take 30 mg by mouth daily.   meclizine 25 MG tablet Commonly known as:  ANTIVERT Take 25 mg by mouth 3 (three) times daily as needed for dizziness.   NAMZARIC 28-10 MG Cp24 Generic drug:  Memantine HCl-Donepezil HCl Take 1 capsule by mouth daily.            Durable Medical Equipment        Start     Ordered   12/28/16 [phone]  For home use only DME Walker  Once    Question:  Patient needs a walker to treat with the following condition  Answer:  Weakness   12/28/16 0936      Follow-up Information    Neale Burly, MD. Schedule an appointment as soon as possible for a visit in 1 week(s).   Specialty:  Internal Medicine Contact information: [address] [place] [id number] [phone]        Grace Isaac, MD. Schedule an appointment as soon as possible for a visit in 1 week(s).   Specialty:  Cardiothoracic Surgery Why:  AS Contact information: [address] [place]  [place] [address] [phone]        Rozann Lesches, MD. Schedule an appointment as soon as possible for a visit in 1 week(s).   Specialty:  Cardiology Why:  AS, Mild bradycardia Contact information: [place] [address] [phone]        Phillips Odor, MD. Schedule an  appointment as soon as possible for a visit in 1 week(s).   Specialty:  Neurology Why:  Dementia Contact information: [address] [place] [address] [phone]           Major procedures and Radiology Reports - PLEASE review detailed and final reports thoroughly  -      MRI Brain - Non acute  TTE - - Left ventricle: The cavity size was normal. Systolic function wasnormal. The estimated ejection fraction was in the range of 60%to 65%. Wall motion was normal; there were no regional wall motion abnormalities. Doppler parameters are consistent withabnormal left ventricular relaxation (grade 1 diastolic dysfunction). - Aortic valve: Valve mobility was restricted. There was moderatestenosis. There was mild regurgitation. Peak velocity (S): 362cm/s. Mean gradient (S): 29 mm Hg.   Ct Head Wo Contrast  Result Date: 12/26/2016 CLINICAL DATA:  Worsening of dizziness, lightheadedness over the last 2 days, some nausea EXAM: CT HEAD WITHOUT CONTRAST TECHNIQUE: Contiguous axial images were obtained from the base of the skull through the vertex without intravenous contrast. COMPARISON:  None. FINDINGS: Brain: The ventricular system is prominent as are the cortical sulci, consistent with diffuse atrophy. The septum is midline in position. The fourth ventricle and basilar cisterns are unremarkable. Moderate small vessel ischemic change is noted throughout the periventricular white matter. No hemorrhage, mass lesion, or acute infarction is seen. Vascular: No vascular abnormality is seen on this unenhanced study. Skull: No calvarial lesion is seen. Sinuses/Orbits: The paranasal sinuses are pneumatized. The maxillary sinuses are not visualized. Other: None IMPRESSION: Moderate small vessel ischemic change. Atrophy. No acute intracranial abnormality. Electronically Signed   By: Ivar Drape M.D.   On: 12/26/2016 14:17   Mr Brain Wo Contrast  Result Date: 12/27/2016 CLINICAL DATA:   76 year old male with dizziness, confusion, slurred speech, near syncope. Initial encounter. EXAM: MRI HEAD WITHOUT CONTRAST TECHNIQUE: Multiplanar, multiecho pulse sequences of the brain and surrounding structures were obtained without intravenous contrast. COMPARISON:  Head CT without contrast 12/26/2016. FINDINGS: Brain: No restricted diffusion to suggest acute infarction. No midline shift, mass effect, evidence of mass lesion, ventriculomegaly, extra-axial collection or acute intracranial hemorrhage. Cervicomedullary junction and pituitary are within normal limits. Scattered and patchy bilateral cerebral white matter T2 and FLAIR hyperintensity, in a nonspecific configuration. No cortical encephalomalacia or chronic cerebral blood products identified. Nonspecific T2 heterogeneity in the right globus pallidus. Other deep gray matter nuclei, the brainstem, and the cerebellum are normal for age. Vascular: Major intracranial vascular flow voids are preserved. Skull and upper cervical spine: Negative. Normal bone marrow signal. Sinuses/Orbits: Postoperative changes to both globes. Otherwise negative orbits soft tissues. Trace paranasal sinus mucosal thickening. Other: Visible internal auditory structures appear normal. Mastoids are clear. Negative scalp soft tissues. IMPRESSION: 1.  No acute intracranial abnormality. 2. Mild to moderate for age nonspecific cerebral white matter signal changes, most commonly due to chronic small vessel disease. Electronically Signed   By: Genevie Ann M.D.   On: 12/27/2016 09:59   Mr Jodene Nam Head/brain [id number] Cm  Result Date: 12/27/2016 CLINICAL DATA:  76 year old male with dizziness, confusion, slurred speech, near syncope. Initial encounter. EXAM: MRA HEAD WITHOUT CONTRAST TECHNIQUE: Angiographic images of the Circle of Willis were obtained using MRA technique without intravenous contrast. COMPARISON:  Brain MRI today reported separately. FINDINGS: Antegrade flow in the posterior  circulation with dominant distal left vertebral artery. Mild vertebral tortuosity without stenosis. Patent PICA origins and vertebrobasilar junction. Mild basilar tortuosity and irregularity without stenosis. SCA and  PCA origins are normal. Posterior communicating arteries are diminutive or absent. Bilateral PCA branches are within normal limits. Antegrade flow in both ICA siphons. Severe tortuosity of both distal cervical ICAs. Mild to moderate siphon irregularity in keeping with atherosclerosis. No hemodynamically significant siphon stenosis. Ophthalmic artery origins, carotid termini, MCA and ACA origins are normal. Mildly dominant left ACA A1 segment. Anterior communicating artery and visualized ACA branches are within normal limits. Mild bilateral left MCA M1 segment irregularity and stenosis. Both MCA bifurcations are patent. No MCA branch occlusion identified. 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 Electronically Signed   By: Genevie Ann M.D.   On: 12/27/2016 10:02    Micro Results     No results found for this or any previous visit (from the past 240 hour(s)).  Today   Subjective    Martavis Lunder today has no headache,no chest abdominal pain,no new weakness tingling or numbness, feels much better wants to go home today.     Objective   Blood pressure (!) 164/60, pulse (!) 49, temperature 97.4 F (36.3 C), temperature source Oral, resp. rate 18, height 5\' 4"  (1.626 m), weight 79.4 kg (175 lb), SpO2 93 %.   Intake/Output Summary (Last 24 hours) at 12/30/16 1300 Last data filed at 12/30/16 1101  Gross per 24 hour  Intake              720 ml  Output              300 ml  Net              420 ml    Exam Awake , Mildly confused confusion is more intermittent, Oriented X 2, No new F.N deficits, Normal affect [place].AT,PERRAL Supple Neck,No JVD, No  cervical lymphadenopathy appriciated.  Symmetrical Chest wall movement, Good air movement bilaterally, CTAB RRR,No Gallops,Rubs or new Murmurs, No Parasternal Heave +ve B.Sounds, Abd Soft, Non tender, No organomegaly appriciated, No rebound -guarding or rigidity. No Cyanosis, Clubbing or edema, No new Rash or bruise   Data Review   CBC w Diff: Lab Results  Component Value Date   WBC 7.1 12/26/2016   HGB 16.0 12/26/2016   HCT 46.9 12/26/2016   PLT 198 12/26/2016   LYMPHOPCT 21 12/26/2016   MONOPCT 13 12/26/2016   EOSPCT 2 12/26/2016   BASOPCT 0 12/26/2016    CMP: Lab Results  Component Value Date   NA 138 12/28/2016   K 3.6 12/29/2016   CL 110 12/28/2016   CO2 24 12/28/2016   BUN 14 12/28/2016   CREATININE 0.88 12/28/2016   PROT 7.4 12/26/2016   ALBUMIN 4.4 12/26/2016   BILITOT 1.3 (H) 12/26/2016   ALKPHOS 71 12/26/2016   AST 24 12/26/2016   ALT 14 (L) 12/26/2016  .   Total Time in preparing paper work, data evaluation and todays exam - 35 minutes  Thurnell Lose M.D on 12/30/2016 at 1:00 PM  [facility]   Office  [phone]

## 2016-12-31 DIAGNOSIS — I69920 Aphasia following unspecified cerebrovascular disease: Secondary | ICD-10-CM | POA: Diagnosis not present

## 2016-12-31 DIAGNOSIS — Z7982 Long term (current) use of aspirin: Secondary | ICD-10-CM | POA: Diagnosis not present

## 2016-12-31 DIAGNOSIS — I69922 Dysarthria following unspecified cerebrovascular disease: Secondary | ICD-10-CM | POA: Diagnosis not present

## 2016-12-31 DIAGNOSIS — I951 Orthostatic hypotension: Secondary | ICD-10-CM | POA: Diagnosis not present

## 2016-12-31 DIAGNOSIS — G9349 Other encephalopathy: Secondary | ICD-10-CM | POA: Diagnosis not present

## 2016-12-31 DIAGNOSIS — F0280 Dementia in other diseases classified elsewhere without behavioral disturbance: Secondary | ICD-10-CM | POA: Diagnosis not present

## 2016-12-31 DIAGNOSIS — I350 Nonrheumatic aortic (valve) stenosis: Secondary | ICD-10-CM | POA: Diagnosis not present

## 2016-12-31 DIAGNOSIS — E785 Hyperlipidemia, unspecified: Secondary | ICD-10-CM | POA: Diagnosis not present

## 2016-12-31 DIAGNOSIS — Z9181 History of falling: Secondary | ICD-10-CM | POA: Diagnosis not present

## 2016-12-31 DIAGNOSIS — F0390 Unspecified dementia without behavioral disturbance: Secondary | ICD-10-CM | POA: Diagnosis not present

## 2016-12-31 DIAGNOSIS — I10 Essential (primary) hypertension: Secondary | ICD-10-CM | POA: Diagnosis not present

## 2016-12-31 DIAGNOSIS — I69320 Aphasia following cerebral infarction: Secondary | ICD-10-CM | POA: Diagnosis not present

## 2017-01-01 DIAGNOSIS — I69920 Aphasia following unspecified cerebrovascular disease: Secondary | ICD-10-CM | POA: Diagnosis not present

## 2017-01-01 DIAGNOSIS — G9349 Other encephalopathy: Secondary | ICD-10-CM | POA: Diagnosis not present

## 2017-01-01 DIAGNOSIS — I69922 Dysarthria following unspecified cerebrovascular disease: Secondary | ICD-10-CM | POA: Diagnosis not present

## 2017-01-01 DIAGNOSIS — F0390 Unspecified dementia without behavioral disturbance: Secondary | ICD-10-CM | POA: Diagnosis not present

## 2017-01-01 DIAGNOSIS — I951 Orthostatic hypotension: Secondary | ICD-10-CM | POA: Diagnosis not present

## 2017-01-01 DIAGNOSIS — I10 Essential (primary) hypertension: Secondary | ICD-10-CM | POA: Diagnosis not present

## 2017-01-02 DIAGNOSIS — F0390 Unspecified dementia without behavioral disturbance: Secondary | ICD-10-CM | POA: Diagnosis not present

## 2017-01-02 DIAGNOSIS — G9349 Other encephalopathy: Secondary | ICD-10-CM | POA: Diagnosis not present

## 2017-01-02 DIAGNOSIS — I951 Orthostatic hypotension: Secondary | ICD-10-CM | POA: Diagnosis not present

## 2017-01-02 DIAGNOSIS — I69920 Aphasia following unspecified cerebrovascular disease: Secondary | ICD-10-CM | POA: Diagnosis not present

## 2017-01-02 DIAGNOSIS — I10 Essential (primary) hypertension: Secondary | ICD-10-CM | POA: Diagnosis not present

## 2017-01-02 DIAGNOSIS — I69922 Dysarthria following unspecified cerebrovascular disease: Secondary | ICD-10-CM | POA: Diagnosis not present

## 2017-01-05 DIAGNOSIS — I10 Essential (primary) hypertension: Secondary | ICD-10-CM | POA: Diagnosis not present

## 2017-01-05 DIAGNOSIS — F0390 Unspecified dementia without behavioral disturbance: Secondary | ICD-10-CM | POA: Diagnosis not present

## 2017-01-05 DIAGNOSIS — I951 Orthostatic hypotension: Secondary | ICD-10-CM | POA: Diagnosis not present

## 2017-01-05 DIAGNOSIS — G9349 Other encephalopathy: Secondary | ICD-10-CM | POA: Diagnosis not present

## 2017-01-05 DIAGNOSIS — I69922 Dysarthria following unspecified cerebrovascular disease: Secondary | ICD-10-CM | POA: Diagnosis not present

## 2017-01-05 DIAGNOSIS — I69920 Aphasia following unspecified cerebrovascular disease: Secondary | ICD-10-CM | POA: Diagnosis not present

## 2017-01-06 DIAGNOSIS — I10 Essential (primary) hypertension: Secondary | ICD-10-CM | POA: Diagnosis not present

## 2017-01-06 DIAGNOSIS — I69920 Aphasia following unspecified cerebrovascular disease: Secondary | ICD-10-CM | POA: Diagnosis not present

## 2017-01-06 DIAGNOSIS — G9349 Other encephalopathy: Secondary | ICD-10-CM | POA: Diagnosis not present

## 2017-01-06 DIAGNOSIS — F0390 Unspecified dementia without behavioral disturbance: Secondary | ICD-10-CM | POA: Diagnosis not present

## 2017-01-06 DIAGNOSIS — I951 Orthostatic hypotension: Secondary | ICD-10-CM | POA: Diagnosis not present

## 2017-01-06 DIAGNOSIS — I69922 Dysarthria following unspecified cerebrovascular disease: Secondary | ICD-10-CM | POA: Diagnosis not present

## 2017-01-08 DIAGNOSIS — K210 Gastro-esophageal reflux disease with esophagitis: Secondary | ICD-10-CM | POA: Diagnosis not present

## 2017-01-08 DIAGNOSIS — J440 Chronic obstructive pulmonary disease with acute lower respiratory infection: Secondary | ICD-10-CM | POA: Diagnosis not present

## 2017-01-08 DIAGNOSIS — E784 Other hyperlipidemia: Secondary | ICD-10-CM | POA: Diagnosis not present

## 2017-01-08 DIAGNOSIS — I2584 Coronary atherosclerosis due to calcified coronary lesion: Secondary | ICD-10-CM | POA: Diagnosis not present

## 2017-01-08 DIAGNOSIS — Z79899 Other long term (current) drug therapy: Secondary | ICD-10-CM | POA: Diagnosis not present

## 2017-01-09 DIAGNOSIS — I69922 Dysarthria following unspecified cerebrovascular disease: Secondary | ICD-10-CM | POA: Diagnosis not present

## 2017-01-09 DIAGNOSIS — R05 Cough: Secondary | ICD-10-CM | POA: Diagnosis not present

## 2017-01-09 DIAGNOSIS — F0390 Unspecified dementia without behavioral disturbance: Secondary | ICD-10-CM | POA: Diagnosis not present

## 2017-01-09 DIAGNOSIS — I951 Orthostatic hypotension: Secondary | ICD-10-CM | POA: Diagnosis not present

## 2017-01-09 DIAGNOSIS — G9349 Other encephalopathy: Secondary | ICD-10-CM | POA: Diagnosis not present

## 2017-01-09 DIAGNOSIS — J40 Bronchitis, not specified as acute or chronic: Secondary | ICD-10-CM | POA: Diagnosis not present

## 2017-01-09 DIAGNOSIS — I10 Essential (primary) hypertension: Secondary | ICD-10-CM | POA: Diagnosis not present

## 2017-01-09 DIAGNOSIS — I69920 Aphasia following unspecified cerebrovascular disease: Secondary | ICD-10-CM | POA: Diagnosis not present

## 2017-01-12 DIAGNOSIS — N390 Urinary tract infection, site not specified: Secondary | ICD-10-CM | POA: Diagnosis not present

## 2017-01-12 DIAGNOSIS — B965 Pseudomonas (aeruginosa) (mallei) (pseudomallei) as the cause of diseases classified elsewhere: Secondary | ICD-10-CM | POA: Diagnosis not present

## 2017-01-12 DIAGNOSIS — F05 Delirium due to known physiological condition: Secondary | ICD-10-CM | POA: Diagnosis not present

## 2017-01-12 DIAGNOSIS — I69922 Dysarthria following unspecified cerebrovascular disease: Secondary | ICD-10-CM | POA: Diagnosis not present

## 2017-01-12 DIAGNOSIS — R061 Stridor: Secondary | ICD-10-CM | POA: Diagnosis not present

## 2017-01-12 DIAGNOSIS — F0390 Unspecified dementia without behavioral disturbance: Secondary | ICD-10-CM | POA: Diagnosis not present

## 2017-01-12 DIAGNOSIS — I10 Essential (primary) hypertension: Secondary | ICD-10-CM | POA: Diagnosis not present

## 2017-01-12 DIAGNOSIS — R404 Transient alteration of awareness: Secondary | ICD-10-CM | POA: Diagnosis not present

## 2017-01-12 DIAGNOSIS — I69920 Aphasia following unspecified cerebrovascular disease: Secondary | ICD-10-CM | POA: Diagnosis not present

## 2017-01-12 DIAGNOSIS — J449 Chronic obstructive pulmonary disease, unspecified: Secondary | ICD-10-CM | POA: Diagnosis not present

## 2017-01-12 DIAGNOSIS — G9349 Other encephalopathy: Secondary | ICD-10-CM | POA: Diagnosis not present

## 2017-01-12 DIAGNOSIS — G9340 Encephalopathy, unspecified: Secondary | ICD-10-CM | POA: Diagnosis not present

## 2017-01-12 DIAGNOSIS — I2510 Atherosclerotic heart disease of native coronary artery without angina pectoris: Secondary | ICD-10-CM | POA: Diagnosis not present

## 2017-01-12 DIAGNOSIS — R319 Hematuria, unspecified: Secondary | ICD-10-CM | POA: Diagnosis not present

## 2017-01-12 DIAGNOSIS — E860 Dehydration: Secondary | ICD-10-CM | POA: Diagnosis not present

## 2017-01-12 DIAGNOSIS — R531 Weakness: Secondary | ICD-10-CM | POA: Diagnosis not present

## 2017-01-12 DIAGNOSIS — I951 Orthostatic hypotension: Secondary | ICD-10-CM | POA: Diagnosis not present

## 2017-01-13 DIAGNOSIS — F329 Major depressive disorder, single episode, unspecified: Secondary | ICD-10-CM | POA: Diagnosis not present

## 2017-01-13 DIAGNOSIS — E785 Hyperlipidemia, unspecified: Secondary | ICD-10-CM | POA: Diagnosis present

## 2017-01-13 DIAGNOSIS — R7881 Bacteremia: Secondary | ICD-10-CM | POA: Diagnosis not present

## 2017-01-13 DIAGNOSIS — K5900 Constipation, unspecified: Secondary | ICD-10-CM | POA: Diagnosis not present

## 2017-01-13 DIAGNOSIS — J449 Chronic obstructive pulmonary disease, unspecified: Secondary | ICD-10-CM | POA: Diagnosis present

## 2017-01-13 DIAGNOSIS — R279 Unspecified lack of coordination: Secondary | ICD-10-CM | POA: Diagnosis not present

## 2017-01-13 DIAGNOSIS — R2689 Other abnormalities of gait and mobility: Secondary | ICD-10-CM | POA: Diagnosis not present

## 2017-01-13 DIAGNOSIS — R410 Disorientation, unspecified: Secondary | ICD-10-CM | POA: Diagnosis not present

## 2017-01-13 DIAGNOSIS — Z79899 Other long term (current) drug therapy: Secondary | ICD-10-CM | POA: Diagnosis not present

## 2017-01-13 DIAGNOSIS — R531 Weakness: Secondary | ICD-10-CM | POA: Diagnosis present

## 2017-01-13 DIAGNOSIS — Z7401 Bed confinement status: Secondary | ICD-10-CM | POA: Diagnosis not present

## 2017-01-13 DIAGNOSIS — N390 Urinary tract infection, site not specified: Secondary | ICD-10-CM | POA: Diagnosis not present

## 2017-01-13 DIAGNOSIS — K219 Gastro-esophageal reflux disease without esophagitis: Secondary | ICD-10-CM | POA: Diagnosis present

## 2017-01-13 DIAGNOSIS — R319 Hematuria, unspecified: Secondary | ICD-10-CM | POA: Diagnosis present

## 2017-01-13 DIAGNOSIS — F0390 Unspecified dementia without behavioral disturbance: Secondary | ICD-10-CM | POA: Diagnosis present

## 2017-01-13 DIAGNOSIS — I2510 Atherosclerotic heart disease of native coronary artery without angina pectoris: Secondary | ICD-10-CM | POA: Diagnosis not present

## 2017-01-13 DIAGNOSIS — E860 Dehydration: Secondary | ICD-10-CM | POA: Diagnosis not present

## 2017-01-13 DIAGNOSIS — M6281 Muscle weakness (generalized): Secondary | ICD-10-CM | POA: Diagnosis not present

## 2017-01-13 DIAGNOSIS — R41841 Cognitive communication deficit: Secondary | ICD-10-CM | POA: Diagnosis not present

## 2017-01-13 DIAGNOSIS — I10 Essential (primary) hypertension: Secondary | ICD-10-CM | POA: Diagnosis present

## 2017-01-13 DIAGNOSIS — R1311 Dysphagia, oral phase: Secondary | ICD-10-CM | POA: Diagnosis not present

## 2017-01-13 DIAGNOSIS — B965 Pseudomonas (aeruginosa) (mallei) (pseudomallei) as the cause of diseases classified elsewhere: Secondary | ICD-10-CM | POA: Diagnosis not present

## 2017-01-13 DIAGNOSIS — F05 Delirium due to known physiological condition: Secondary | ICD-10-CM | POA: Diagnosis not present

## 2017-01-17 DIAGNOSIS — E860 Dehydration: Secondary | ICD-10-CM | POA: Diagnosis not present

## 2017-01-17 DIAGNOSIS — F05 Delirium due to known physiological condition: Secondary | ICD-10-CM | POA: Diagnosis not present

## 2017-01-17 DIAGNOSIS — R279 Unspecified lack of coordination: Secondary | ICD-10-CM | POA: Diagnosis not present

## 2017-01-17 DIAGNOSIS — R7881 Bacteremia: Secondary | ICD-10-CM | POA: Diagnosis not present

## 2017-01-17 DIAGNOSIS — R410 Disorientation, unspecified: Secondary | ICD-10-CM | POA: Diagnosis not present

## 2017-01-17 DIAGNOSIS — I10 Essential (primary) hypertension: Secondary | ICD-10-CM | POA: Diagnosis not present

## 2017-01-17 DIAGNOSIS — F329 Major depressive disorder, single episode, unspecified: Secondary | ICD-10-CM | POA: Diagnosis not present

## 2017-01-17 DIAGNOSIS — E785 Hyperlipidemia, unspecified: Secondary | ICD-10-CM | POA: Diagnosis not present

## 2017-01-17 DIAGNOSIS — K5900 Constipation, unspecified: Secondary | ICD-10-CM | POA: Diagnosis not present

## 2017-01-17 DIAGNOSIS — I2510 Atherosclerotic heart disease of native coronary artery without angina pectoris: Secondary | ICD-10-CM | POA: Diagnosis not present

## 2017-01-17 DIAGNOSIS — R2689 Other abnormalities of gait and mobility: Secondary | ICD-10-CM | POA: Diagnosis not present

## 2017-01-17 DIAGNOSIS — R1311 Dysphagia, oral phase: Secondary | ICD-10-CM | POA: Diagnosis not present

## 2017-01-17 DIAGNOSIS — B965 Pseudomonas (aeruginosa) (mallei) (pseudomallei) as the cause of diseases classified elsewhere: Secondary | ICD-10-CM | POA: Diagnosis not present

## 2017-01-17 DIAGNOSIS — K219 Gastro-esophageal reflux disease without esophagitis: Secondary | ICD-10-CM | POA: Diagnosis not present

## 2017-01-17 DIAGNOSIS — F0390 Unspecified dementia without behavioral disturbance: Secondary | ICD-10-CM | POA: Diagnosis not present

## 2017-01-17 DIAGNOSIS — N390 Urinary tract infection, site not specified: Secondary | ICD-10-CM | POA: Diagnosis not present

## 2017-01-17 DIAGNOSIS — M6281 Muscle weakness (generalized): Secondary | ICD-10-CM | POA: Diagnosis not present

## 2017-01-17 DIAGNOSIS — R41841 Cognitive communication deficit: Secondary | ICD-10-CM | POA: Diagnosis not present

## 2017-01-17 DIAGNOSIS — J449 Chronic obstructive pulmonary disease, unspecified: Secondary | ICD-10-CM | POA: Diagnosis not present

## 2017-01-17 DIAGNOSIS — Z7401 Bed confinement status: Secondary | ICD-10-CM | POA: Diagnosis not present

## 2017-02-23 DIAGNOSIS — I69920 Aphasia following unspecified cerebrovascular disease: Secondary | ICD-10-CM | POA: Diagnosis not present

## 2017-02-23 DIAGNOSIS — I951 Orthostatic hypotension: Secondary | ICD-10-CM | POA: Diagnosis not present

## 2017-02-23 DIAGNOSIS — I69922 Dysarthria following unspecified cerebrovascular disease: Secondary | ICD-10-CM | POA: Diagnosis not present

## 2017-02-23 DIAGNOSIS — I10 Essential (primary) hypertension: Secondary | ICD-10-CM | POA: Diagnosis not present

## 2017-02-23 DIAGNOSIS — G9349 Other encephalopathy: Secondary | ICD-10-CM | POA: Diagnosis not present

## 2017-02-23 DIAGNOSIS — F0390 Unspecified dementia without behavioral disturbance: Secondary | ICD-10-CM | POA: Diagnosis not present

## 2017-02-25 DIAGNOSIS — I69920 Aphasia following unspecified cerebrovascular disease: Secondary | ICD-10-CM | POA: Diagnosis not present

## 2017-02-25 DIAGNOSIS — I951 Orthostatic hypotension: Secondary | ICD-10-CM | POA: Diagnosis not present

## 2017-02-25 DIAGNOSIS — I10 Essential (primary) hypertension: Secondary | ICD-10-CM | POA: Diagnosis not present

## 2017-02-25 DIAGNOSIS — G9349 Other encephalopathy: Secondary | ICD-10-CM | POA: Diagnosis not present

## 2017-02-25 DIAGNOSIS — I69922 Dysarthria following unspecified cerebrovascular disease: Secondary | ICD-10-CM | POA: Diagnosis not present

## 2017-02-25 DIAGNOSIS — F0390 Unspecified dementia without behavioral disturbance: Secondary | ICD-10-CM | POA: Diagnosis not present

## 2017-02-26 DIAGNOSIS — I69920 Aphasia following unspecified cerebrovascular disease: Secondary | ICD-10-CM | POA: Diagnosis not present

## 2017-02-26 DIAGNOSIS — J440 Chronic obstructive pulmonary disease with acute lower respiratory infection: Secondary | ICD-10-CM | POA: Diagnosis not present

## 2017-02-26 DIAGNOSIS — I10 Essential (primary) hypertension: Secondary | ICD-10-CM | POA: Diagnosis not present

## 2017-02-26 DIAGNOSIS — I69922 Dysarthria following unspecified cerebrovascular disease: Secondary | ICD-10-CM | POA: Diagnosis not present

## 2017-02-26 DIAGNOSIS — E784 Other hyperlipidemia: Secondary | ICD-10-CM | POA: Diagnosis not present

## 2017-02-26 DIAGNOSIS — G9349 Other encephalopathy: Secondary | ICD-10-CM | POA: Diagnosis not present

## 2017-02-26 DIAGNOSIS — K210 Gastro-esophageal reflux disease with esophagitis: Secondary | ICD-10-CM | POA: Diagnosis not present

## 2017-02-26 DIAGNOSIS — F0390 Unspecified dementia without behavioral disturbance: Secondary | ICD-10-CM | POA: Diagnosis not present

## 2017-02-26 DIAGNOSIS — I2584 Coronary atherosclerosis due to calcified coronary lesion: Secondary | ICD-10-CM | POA: Diagnosis not present

## 2017-02-26 DIAGNOSIS — I951 Orthostatic hypotension: Secondary | ICD-10-CM | POA: Diagnosis not present

## 2017-03-01 DIAGNOSIS — I350 Nonrheumatic aortic (valve) stenosis: Secondary | ICD-10-CM | POA: Diagnosis not present

## 2017-03-01 DIAGNOSIS — F0390 Unspecified dementia without behavioral disturbance: Secondary | ICD-10-CM | POA: Diagnosis not present

## 2017-03-01 DIAGNOSIS — J449 Chronic obstructive pulmonary disease, unspecified: Secondary | ICD-10-CM | POA: Diagnosis not present

## 2017-03-01 DIAGNOSIS — E785 Hyperlipidemia, unspecified: Secondary | ICD-10-CM | POA: Diagnosis not present

## 2017-03-01 DIAGNOSIS — F329 Major depressive disorder, single episode, unspecified: Secondary | ICD-10-CM | POA: Diagnosis not present

## 2017-03-01 DIAGNOSIS — M6281 Muscle weakness (generalized): Secondary | ICD-10-CM | POA: Diagnosis not present

## 2017-03-01 DIAGNOSIS — I2510 Atherosclerotic heart disease of native coronary artery without angina pectoris: Secondary | ICD-10-CM | POA: Diagnosis not present

## 2017-03-01 DIAGNOSIS — Z9181 History of falling: Secondary | ICD-10-CM | POA: Diagnosis not present

## 2017-03-01 DIAGNOSIS — I10 Essential (primary) hypertension: Secondary | ICD-10-CM | POA: Diagnosis not present

## 2017-03-01 DIAGNOSIS — Z87440 Personal history of urinary (tract) infections: Secondary | ICD-10-CM | POA: Diagnosis not present

## 2017-03-01 DIAGNOSIS — K219 Gastro-esophageal reflux disease without esophagitis: Secondary | ICD-10-CM | POA: Diagnosis not present

## 2017-03-01 DIAGNOSIS — R41841 Cognitive communication deficit: Secondary | ICD-10-CM | POA: Diagnosis not present

## 2017-03-01 DIAGNOSIS — Z742 Need for assistance at home and no other household member able to render care: Secondary | ICD-10-CM | POA: Diagnosis not present

## 2017-03-01 DIAGNOSIS — Z7982 Long term (current) use of aspirin: Secondary | ICD-10-CM | POA: Diagnosis not present

## 2017-03-02 DIAGNOSIS — R41841 Cognitive communication deficit: Secondary | ICD-10-CM | POA: Diagnosis not present

## 2017-03-02 DIAGNOSIS — I10 Essential (primary) hypertension: Secondary | ICD-10-CM | POA: Diagnosis not present

## 2017-03-02 DIAGNOSIS — J449 Chronic obstructive pulmonary disease, unspecified: Secondary | ICD-10-CM | POA: Diagnosis not present

## 2017-03-02 DIAGNOSIS — F0390 Unspecified dementia without behavioral disturbance: Secondary | ICD-10-CM | POA: Diagnosis not present

## 2017-03-02 DIAGNOSIS — I2510 Atherosclerotic heart disease of native coronary artery without angina pectoris: Secondary | ICD-10-CM | POA: Diagnosis not present

## 2017-03-02 DIAGNOSIS — M6281 Muscle weakness (generalized): Secondary | ICD-10-CM | POA: Diagnosis not present

## 2017-03-03 DIAGNOSIS — F0390 Unspecified dementia without behavioral disturbance: Secondary | ICD-10-CM | POA: Diagnosis not present

## 2017-03-03 DIAGNOSIS — R41841 Cognitive communication deficit: Secondary | ICD-10-CM | POA: Diagnosis not present

## 2017-03-03 DIAGNOSIS — I2510 Atherosclerotic heart disease of native coronary artery without angina pectoris: Secondary | ICD-10-CM | POA: Diagnosis not present

## 2017-03-03 DIAGNOSIS — M6281 Muscle weakness (generalized): Secondary | ICD-10-CM | POA: Diagnosis not present

## 2017-03-03 DIAGNOSIS — J449 Chronic obstructive pulmonary disease, unspecified: Secondary | ICD-10-CM | POA: Diagnosis not present

## 2017-03-03 DIAGNOSIS — I10 Essential (primary) hypertension: Secondary | ICD-10-CM | POA: Diagnosis not present

## 2017-03-04 DIAGNOSIS — I2510 Atherosclerotic heart disease of native coronary artery without angina pectoris: Secondary | ICD-10-CM | POA: Diagnosis not present

## 2017-03-04 DIAGNOSIS — M6281 Muscle weakness (generalized): Secondary | ICD-10-CM | POA: Diagnosis not present

## 2017-03-04 DIAGNOSIS — R41841 Cognitive communication deficit: Secondary | ICD-10-CM | POA: Diagnosis not present

## 2017-03-04 DIAGNOSIS — J449 Chronic obstructive pulmonary disease, unspecified: Secondary | ICD-10-CM | POA: Diagnosis not present

## 2017-03-04 DIAGNOSIS — I10 Essential (primary) hypertension: Secondary | ICD-10-CM | POA: Diagnosis not present

## 2017-03-04 DIAGNOSIS — F0390 Unspecified dementia without behavioral disturbance: Secondary | ICD-10-CM | POA: Diagnosis not present

## 2017-03-05 DIAGNOSIS — R41841 Cognitive communication deficit: Secondary | ICD-10-CM | POA: Diagnosis not present

## 2017-03-05 DIAGNOSIS — J449 Chronic obstructive pulmonary disease, unspecified: Secondary | ICD-10-CM | POA: Diagnosis not present

## 2017-03-05 DIAGNOSIS — M6281 Muscle weakness (generalized): Secondary | ICD-10-CM | POA: Diagnosis not present

## 2017-03-05 DIAGNOSIS — F0390 Unspecified dementia without behavioral disturbance: Secondary | ICD-10-CM | POA: Diagnosis not present

## 2017-03-05 DIAGNOSIS — I2510 Atherosclerotic heart disease of native coronary artery without angina pectoris: Secondary | ICD-10-CM | POA: Diagnosis not present

## 2017-03-05 DIAGNOSIS — I10 Essential (primary) hypertension: Secondary | ICD-10-CM | POA: Diagnosis not present

## 2017-03-10 DIAGNOSIS — I2510 Atherosclerotic heart disease of native coronary artery without angina pectoris: Secondary | ICD-10-CM | POA: Diagnosis not present

## 2017-03-10 DIAGNOSIS — I10 Essential (primary) hypertension: Secondary | ICD-10-CM | POA: Diagnosis not present

## 2017-03-10 DIAGNOSIS — F0390 Unspecified dementia without behavioral disturbance: Secondary | ICD-10-CM | POA: Diagnosis not present

## 2017-03-10 DIAGNOSIS — R41841 Cognitive communication deficit: Secondary | ICD-10-CM | POA: Diagnosis not present

## 2017-03-10 DIAGNOSIS — M6281 Muscle weakness (generalized): Secondary | ICD-10-CM | POA: Diagnosis not present

## 2017-03-10 DIAGNOSIS — J449 Chronic obstructive pulmonary disease, unspecified: Secondary | ICD-10-CM | POA: Diagnosis not present

## 2017-03-17 DIAGNOSIS — I2510 Atherosclerotic heart disease of native coronary artery without angina pectoris: Secondary | ICD-10-CM | POA: Diagnosis not present

## 2017-03-17 DIAGNOSIS — F0390 Unspecified dementia without behavioral disturbance: Secondary | ICD-10-CM | POA: Diagnosis not present

## 2017-03-17 DIAGNOSIS — J449 Chronic obstructive pulmonary disease, unspecified: Secondary | ICD-10-CM | POA: Diagnosis not present

## 2017-03-17 DIAGNOSIS — I10 Essential (primary) hypertension: Secondary | ICD-10-CM | POA: Diagnosis not present

## 2017-03-17 DIAGNOSIS — R41841 Cognitive communication deficit: Secondary | ICD-10-CM | POA: Diagnosis not present

## 2017-03-17 DIAGNOSIS — M6281 Muscle weakness (generalized): Secondary | ICD-10-CM | POA: Diagnosis not present

## 2017-03-23 DIAGNOSIS — F0390 Unspecified dementia without behavioral disturbance: Secondary | ICD-10-CM | POA: Diagnosis not present

## 2017-03-23 DIAGNOSIS — R41841 Cognitive communication deficit: Secondary | ICD-10-CM | POA: Diagnosis not present

## 2017-03-23 DIAGNOSIS — I10 Essential (primary) hypertension: Secondary | ICD-10-CM | POA: Diagnosis not present

## 2017-03-23 DIAGNOSIS — I2510 Atherosclerotic heart disease of native coronary artery without angina pectoris: Secondary | ICD-10-CM | POA: Diagnosis not present

## 2017-03-23 DIAGNOSIS — M6281 Muscle weakness (generalized): Secondary | ICD-10-CM | POA: Diagnosis not present

## 2017-03-23 DIAGNOSIS — J449 Chronic obstructive pulmonary disease, unspecified: Secondary | ICD-10-CM | POA: Diagnosis not present

## 2017-04-02 DIAGNOSIS — I10 Essential (primary) hypertension: Secondary | ICD-10-CM | POA: Diagnosis not present

## 2017-04-02 DIAGNOSIS — I2510 Atherosclerotic heart disease of native coronary artery without angina pectoris: Secondary | ICD-10-CM | POA: Diagnosis not present

## 2017-04-02 DIAGNOSIS — F0390 Unspecified dementia without behavioral disturbance: Secondary | ICD-10-CM | POA: Diagnosis not present

## 2017-04-02 DIAGNOSIS — R41841 Cognitive communication deficit: Secondary | ICD-10-CM | POA: Diagnosis not present

## 2017-04-02 DIAGNOSIS — M6281 Muscle weakness (generalized): Secondary | ICD-10-CM | POA: Diagnosis not present

## 2017-04-02 DIAGNOSIS — J449 Chronic obstructive pulmonary disease, unspecified: Secondary | ICD-10-CM | POA: Diagnosis not present

## 2017-04-09 DIAGNOSIS — J441 Chronic obstructive pulmonary disease with (acute) exacerbation: Secondary | ICD-10-CM | POA: Diagnosis not present

## 2017-04-09 DIAGNOSIS — R41841 Cognitive communication deficit: Secondary | ICD-10-CM | POA: Diagnosis not present

## 2017-04-09 DIAGNOSIS — F0280 Dementia in other diseases classified elsewhere without behavioral disturbance: Secondary | ICD-10-CM | POA: Diagnosis not present

## 2017-04-09 DIAGNOSIS — M6281 Muscle weakness (generalized): Secondary | ICD-10-CM | POA: Diagnosis not present

## 2017-04-14 DIAGNOSIS — Z79899 Other long term (current) drug therapy: Secondary | ICD-10-CM | POA: Diagnosis not present

## 2017-04-14 DIAGNOSIS — R55 Syncope and collapse: Secondary | ICD-10-CM | POA: Diagnosis not present

## 2017-04-14 DIAGNOSIS — R05 Cough: Secondary | ICD-10-CM | POA: Diagnosis not present

## 2017-04-14 DIAGNOSIS — W1839XA Other fall on same level, initial encounter: Secondary | ICD-10-CM | POA: Diagnosis not present

## 2017-04-14 DIAGNOSIS — Z8249 Family history of ischemic heart disease and other diseases of the circulatory system: Secondary | ICD-10-CM | POA: Diagnosis not present

## 2017-04-14 DIAGNOSIS — R008 Other abnormalities of heart beat: Secondary | ICD-10-CM | POA: Diagnosis not present

## 2017-04-14 DIAGNOSIS — F0280 Dementia in other diseases classified elsewhere without behavioral disturbance: Secondary | ICD-10-CM | POA: Diagnosis not present

## 2017-04-14 DIAGNOSIS — Z8673 Personal history of transient ischemic attack (TIA), and cerebral infarction without residual deficits: Secondary | ICD-10-CM | POA: Diagnosis not present

## 2017-04-14 DIAGNOSIS — Z955 Presence of coronary angioplasty implant and graft: Secondary | ICD-10-CM | POA: Diagnosis not present

## 2017-04-14 DIAGNOSIS — I2510 Atherosclerotic heart disease of native coronary artery without angina pectoris: Secondary | ICD-10-CM | POA: Diagnosis not present

## 2017-04-14 DIAGNOSIS — N179 Acute kidney failure, unspecified: Secondary | ICD-10-CM | POA: Diagnosis not present

## 2017-04-14 DIAGNOSIS — E860 Dehydration: Secondary | ICD-10-CM | POA: Diagnosis not present

## 2017-04-14 DIAGNOSIS — Z7982 Long term (current) use of aspirin: Secondary | ICD-10-CM | POA: Diagnosis not present

## 2017-04-14 DIAGNOSIS — Z87891 Personal history of nicotine dependence: Secondary | ICD-10-CM | POA: Diagnosis not present

## 2017-04-14 DIAGNOSIS — Z808 Family history of malignant neoplasm of other organs or systems: Secondary | ICD-10-CM | POA: Diagnosis not present

## 2017-04-14 DIAGNOSIS — E785 Hyperlipidemia, unspecified: Secondary | ICD-10-CM | POA: Diagnosis not present

## 2017-04-14 DIAGNOSIS — Z836 Family history of other diseases of the respiratory system: Secondary | ICD-10-CM | POA: Diagnosis not present

## 2017-04-14 DIAGNOSIS — R262 Difficulty in walking, not elsewhere classified: Secondary | ICD-10-CM | POA: Diagnosis not present

## 2017-04-14 DIAGNOSIS — K219 Gastro-esophageal reflux disease without esophagitis: Secondary | ICD-10-CM | POA: Diagnosis not present

## 2017-04-14 DIAGNOSIS — Z7951 Long term (current) use of inhaled steroids: Secondary | ICD-10-CM | POA: Diagnosis not present

## 2017-04-14 DIAGNOSIS — J449 Chronic obstructive pulmonary disease, unspecified: Secondary | ICD-10-CM | POA: Diagnosis not present

## 2017-04-14 DIAGNOSIS — N178 Other acute kidney failure: Secondary | ICD-10-CM | POA: Diagnosis not present

## 2017-04-14 DIAGNOSIS — F0390 Unspecified dementia without behavioral disturbance: Secondary | ICD-10-CM | POA: Diagnosis not present

## 2017-04-14 DIAGNOSIS — F329 Major depressive disorder, single episode, unspecified: Secondary | ICD-10-CM | POA: Diagnosis not present

## 2017-04-14 DIAGNOSIS — R079 Chest pain, unspecified: Secondary | ICD-10-CM | POA: Diagnosis not present

## 2017-04-14 DIAGNOSIS — I10 Essential (primary) hypertension: Secondary | ICD-10-CM | POA: Diagnosis not present

## 2017-04-14 DIAGNOSIS — R001 Bradycardia, unspecified: Secondary | ICD-10-CM | POA: Diagnosis not present

## 2017-04-14 DIAGNOSIS — R41841 Cognitive communication deficit: Secondary | ICD-10-CM | POA: Diagnosis not present

## 2017-04-14 DIAGNOSIS — M6281 Muscle weakness (generalized): Secondary | ICD-10-CM | POA: Diagnosis not present

## 2017-04-14 DIAGNOSIS — Z9181 History of falling: Secondary | ICD-10-CM | POA: Diagnosis not present

## 2017-04-15 DIAGNOSIS — N178 Other acute kidney failure: Secondary | ICD-10-CM | POA: Diagnosis not present

## 2017-04-15 DIAGNOSIS — F0280 Dementia in other diseases classified elsewhere without behavioral disturbance: Secondary | ICD-10-CM | POA: Diagnosis not present

## 2017-04-15 DIAGNOSIS — R55 Syncope and collapse: Secondary | ICD-10-CM | POA: Diagnosis not present

## 2017-04-15 DIAGNOSIS — R008 Other abnormalities of heart beat: Secondary | ICD-10-CM | POA: Diagnosis not present

## 2017-04-15 DIAGNOSIS — E860 Dehydration: Secondary | ICD-10-CM | POA: Diagnosis not present

## 2017-04-17 DIAGNOSIS — R008 Other abnormalities of heart beat: Secondary | ICD-10-CM | POA: Diagnosis not present

## 2017-04-17 DIAGNOSIS — N178 Other acute kidney failure: Secondary | ICD-10-CM | POA: Diagnosis not present

## 2017-04-17 DIAGNOSIS — R55 Syncope and collapse: Secondary | ICD-10-CM | POA: Diagnosis not present

## 2017-04-17 DIAGNOSIS — F0280 Dementia in other diseases classified elsewhere without behavioral disturbance: Secondary | ICD-10-CM | POA: Diagnosis not present

## 2017-04-17 DIAGNOSIS — E860 Dehydration: Secondary | ICD-10-CM | POA: Diagnosis not present

## 2017-04-27 DIAGNOSIS — R55 Syncope and collapse: Secondary | ICD-10-CM | POA: Diagnosis not present

## 2017-04-27 DIAGNOSIS — I2584 Coronary atherosclerosis due to calcified coronary lesion: Secondary | ICD-10-CM | POA: Diagnosis not present

## 2017-04-28 DIAGNOSIS — M6281 Muscle weakness (generalized): Secondary | ICD-10-CM | POA: Diagnosis not present

## 2017-04-28 DIAGNOSIS — I2510 Atherosclerotic heart disease of native coronary artery without angina pectoris: Secondary | ICD-10-CM | POA: Diagnosis not present

## 2017-04-28 DIAGNOSIS — I10 Essential (primary) hypertension: Secondary | ICD-10-CM | POA: Diagnosis not present

## 2017-04-28 DIAGNOSIS — J449 Chronic obstructive pulmonary disease, unspecified: Secondary | ICD-10-CM | POA: Diagnosis not present

## 2017-04-28 DIAGNOSIS — R41841 Cognitive communication deficit: Secondary | ICD-10-CM | POA: Diagnosis not present

## 2017-04-28 DIAGNOSIS — F0390 Unspecified dementia without behavioral disturbance: Secondary | ICD-10-CM | POA: Diagnosis not present

## 2017-05-05 DIAGNOSIS — Z955 Presence of coronary angioplasty implant and graft: Secondary | ICD-10-CM | POA: Diagnosis not present

## 2017-05-05 DIAGNOSIS — Z8249 Family history of ischemic heart disease and other diseases of the circulatory system: Secondary | ICD-10-CM | POA: Diagnosis not present

## 2017-05-05 DIAGNOSIS — I959 Hypotension, unspecified: Secondary | ICD-10-CM | POA: Diagnosis not present

## 2017-05-05 DIAGNOSIS — Z836 Family history of other diseases of the respiratory system: Secondary | ICD-10-CM | POA: Diagnosis not present

## 2017-05-05 DIAGNOSIS — I10 Essential (primary) hypertension: Secondary | ICD-10-CM | POA: Diagnosis not present

## 2017-05-05 DIAGNOSIS — R001 Bradycardia, unspecified: Secondary | ICD-10-CM | POA: Diagnosis not present

## 2017-05-05 DIAGNOSIS — R531 Weakness: Secondary | ICD-10-CM | POA: Diagnosis not present

## 2017-05-05 DIAGNOSIS — Z79899 Other long term (current) drug therapy: Secondary | ICD-10-CM | POA: Diagnosis not present

## 2017-05-05 DIAGNOSIS — Z7951 Long term (current) use of inhaled steroids: Secondary | ICD-10-CM | POA: Diagnosis not present

## 2017-05-05 DIAGNOSIS — R55 Syncope and collapse: Secondary | ICD-10-CM | POA: Diagnosis not present

## 2017-05-05 DIAGNOSIS — R0602 Shortness of breath: Secondary | ICD-10-CM | POA: Diagnosis not present

## 2017-05-05 DIAGNOSIS — J449 Chronic obstructive pulmonary disease, unspecified: Secondary | ICD-10-CM | POA: Diagnosis not present

## 2017-05-05 DIAGNOSIS — R61 Generalized hyperhidrosis: Secondary | ICD-10-CM | POA: Diagnosis not present

## 2017-05-05 DIAGNOSIS — Z87891 Personal history of nicotine dependence: Secondary | ICD-10-CM | POA: Diagnosis not present

## 2017-05-05 DIAGNOSIS — F0390 Unspecified dementia without behavioral disturbance: Secondary | ICD-10-CM | POA: Diagnosis not present

## 2017-05-05 DIAGNOSIS — I2510 Atherosclerotic heart disease of native coronary artery without angina pectoris: Secondary | ICD-10-CM | POA: Diagnosis not present

## 2017-05-05 DIAGNOSIS — E785 Hyperlipidemia, unspecified: Secondary | ICD-10-CM | POA: Diagnosis not present

## 2017-05-05 DIAGNOSIS — R42 Dizziness and giddiness: Secondary | ICD-10-CM | POA: Diagnosis not present

## 2017-05-05 DIAGNOSIS — N3289 Other specified disorders of bladder: Secondary | ICD-10-CM | POA: Diagnosis not present

## 2017-05-05 DIAGNOSIS — F329 Major depressive disorder, single episode, unspecified: Secondary | ICD-10-CM | POA: Diagnosis not present

## 2017-05-05 DIAGNOSIS — Z808 Family history of malignant neoplasm of other organs or systems: Secondary | ICD-10-CM | POA: Diagnosis not present

## 2017-05-05 DIAGNOSIS — J441 Chronic obstructive pulmonary disease with (acute) exacerbation: Secondary | ICD-10-CM | POA: Diagnosis not present

## 2017-05-05 DIAGNOSIS — F0280 Dementia in other diseases classified elsewhere without behavioral disturbance: Secondary | ICD-10-CM | POA: Diagnosis not present

## 2017-05-05 DIAGNOSIS — Z7982 Long term (current) use of aspirin: Secondary | ICD-10-CM | POA: Diagnosis not present

## 2017-05-06 DIAGNOSIS — R55 Syncope and collapse: Secondary | ICD-10-CM | POA: Diagnosis not present

## 2017-05-06 DIAGNOSIS — J441 Chronic obstructive pulmonary disease with (acute) exacerbation: Secondary | ICD-10-CM | POA: Diagnosis not present

## 2017-05-06 DIAGNOSIS — F0280 Dementia in other diseases classified elsewhere without behavioral disturbance: Secondary | ICD-10-CM | POA: Diagnosis not present

## 2017-05-06 DIAGNOSIS — N3289 Other specified disorders of bladder: Secondary | ICD-10-CM | POA: Diagnosis not present

## 2017-05-06 DIAGNOSIS — I10 Essential (primary) hypertension: Secondary | ICD-10-CM | POA: Diagnosis not present

## 2017-05-07 DIAGNOSIS — Z7409 Other reduced mobility: Secondary | ICD-10-CM | POA: Diagnosis not present

## 2017-05-07 DIAGNOSIS — I2510 Atherosclerotic heart disease of native coronary artery without angina pectoris: Secondary | ICD-10-CM | POA: Diagnosis not present

## 2017-05-07 DIAGNOSIS — F0281 Dementia in other diseases classified elsewhere with behavioral disturbance: Secondary | ICD-10-CM | POA: Diagnosis not present

## 2017-05-07 DIAGNOSIS — I517 Cardiomegaly: Secondary | ICD-10-CM | POA: Diagnosis not present

## 2017-05-07 DIAGNOSIS — R269 Unspecified abnormalities of gait and mobility: Secondary | ICD-10-CM | POA: Diagnosis not present

## 2017-05-07 DIAGNOSIS — G63 Polyneuropathy in diseases classified elsewhere: Secondary | ICD-10-CM | POA: Diagnosis present

## 2017-05-07 DIAGNOSIS — I080 Rheumatic disorders of both mitral and aortic valves: Secondary | ICD-10-CM | POA: Diagnosis not present

## 2017-05-07 DIAGNOSIS — Z87891 Personal history of nicotine dependence: Secondary | ICD-10-CM | POA: Diagnosis not present

## 2017-05-07 DIAGNOSIS — E538 Deficiency of other specified B group vitamins: Secondary | ICD-10-CM | POA: Diagnosis not present

## 2017-05-07 DIAGNOSIS — M6281 Muscle weakness (generalized): Secondary | ICD-10-CM | POA: Diagnosis not present

## 2017-05-07 DIAGNOSIS — Z7982 Long term (current) use of aspirin: Secondary | ICD-10-CM | POA: Diagnosis not present

## 2017-05-07 DIAGNOSIS — I10 Essential (primary) hypertension: Secondary | ICD-10-CM | POA: Diagnosis not present

## 2017-05-07 DIAGNOSIS — N3289 Other specified disorders of bladder: Secondary | ICD-10-CM | POA: Diagnosis not present

## 2017-05-07 DIAGNOSIS — R55 Syncope and collapse: Secondary | ICD-10-CM | POA: Diagnosis not present

## 2017-05-07 DIAGNOSIS — G250 Essential tremor: Secondary | ICD-10-CM | POA: Diagnosis not present

## 2017-05-07 DIAGNOSIS — G308 Other Alzheimer's disease: Secondary | ICD-10-CM | POA: Diagnosis not present

## 2017-05-07 DIAGNOSIS — J449 Chronic obstructive pulmonary disease, unspecified: Secondary | ICD-10-CM | POA: Diagnosis not present

## 2017-05-07 DIAGNOSIS — F0390 Unspecified dementia without behavioral disturbance: Secondary | ICD-10-CM | POA: Diagnosis not present

## 2017-05-07 DIAGNOSIS — Z7951 Long term (current) use of inhaled steroids: Secondary | ICD-10-CM | POA: Diagnosis not present

## 2017-05-07 DIAGNOSIS — R2689 Other abnormalities of gait and mobility: Secondary | ICD-10-CM | POA: Diagnosis not present

## 2017-05-07 DIAGNOSIS — G301 Alzheimer's disease with late onset: Secondary | ICD-10-CM | POA: Diagnosis not present

## 2017-05-07 DIAGNOSIS — R278 Other lack of coordination: Secondary | ICD-10-CM | POA: Diagnosis not present

## 2017-05-07 DIAGNOSIS — E785 Hyperlipidemia, unspecified: Secondary | ICD-10-CM | POA: Diagnosis not present

## 2017-05-07 DIAGNOSIS — I350 Nonrheumatic aortic (valve) stenosis: Secondary | ICD-10-CM | POA: Diagnosis not present

## 2017-05-07 DIAGNOSIS — T441X5A Adverse effect of other parasympathomimetics [cholinergics], initial encounter: Secondary | ICD-10-CM | POA: Diagnosis present

## 2017-05-07 DIAGNOSIS — R279 Unspecified lack of coordination: Secondary | ICD-10-CM | POA: Diagnosis not present

## 2017-05-07 DIAGNOSIS — F329 Major depressive disorder, single episode, unspecified: Secondary | ICD-10-CM | POA: Diagnosis not present

## 2017-05-07 DIAGNOSIS — J441 Chronic obstructive pulmonary disease with (acute) exacerbation: Secondary | ICD-10-CM | POA: Diagnosis not present

## 2017-05-07 DIAGNOSIS — Z7401 Bed confinement status: Secondary | ICD-10-CM | POA: Diagnosis not present

## 2017-05-07 DIAGNOSIS — G309 Alzheimer's disease, unspecified: Secondary | ICD-10-CM | POA: Diagnosis not present

## 2017-05-07 DIAGNOSIS — F0280 Dementia in other diseases classified elsewhere without behavioral disturbance: Secondary | ICD-10-CM | POA: Diagnosis present

## 2017-05-07 DIAGNOSIS — R0602 Shortness of breath: Secondary | ICD-10-CM | POA: Diagnosis not present

## 2017-05-07 DIAGNOSIS — R001 Bradycardia, unspecified: Secondary | ICD-10-CM | POA: Diagnosis not present

## 2017-05-07 DIAGNOSIS — Z79899 Other long term (current) drug therapy: Secondary | ICD-10-CM | POA: Diagnosis not present

## 2017-05-07 DIAGNOSIS — R41841 Cognitive communication deficit: Secondary | ICD-10-CM | POA: Diagnosis not present

## 2017-05-07 DIAGNOSIS — Q238 Other congenital malformations of aortic and mitral valves: Secondary | ICD-10-CM | POA: Diagnosis not present

## 2017-05-12 DIAGNOSIS — E538 Deficiency of other specified B group vitamins: Secondary | ICD-10-CM | POA: Diagnosis not present

## 2017-05-12 DIAGNOSIS — R2689 Other abnormalities of gait and mobility: Secondary | ICD-10-CM | POA: Diagnosis not present

## 2017-05-12 DIAGNOSIS — E785 Hyperlipidemia, unspecified: Secondary | ICD-10-CM | POA: Diagnosis not present

## 2017-05-12 DIAGNOSIS — G308 Other Alzheimer's disease: Secondary | ICD-10-CM | POA: Diagnosis not present

## 2017-05-12 DIAGNOSIS — R001 Bradycardia, unspecified: Secondary | ICD-10-CM | POA: Diagnosis not present

## 2017-05-12 DIAGNOSIS — J441 Chronic obstructive pulmonary disease with (acute) exacerbation: Secondary | ICD-10-CM | POA: Diagnosis not present

## 2017-05-12 DIAGNOSIS — Z7409 Other reduced mobility: Secondary | ICD-10-CM | POA: Diagnosis not present

## 2017-05-12 DIAGNOSIS — F0280 Dementia in other diseases classified elsewhere without behavioral disturbance: Secondary | ICD-10-CM | POA: Diagnosis not present

## 2017-05-12 DIAGNOSIS — M6281 Muscle weakness (generalized): Secondary | ICD-10-CM | POA: Diagnosis not present

## 2017-05-12 DIAGNOSIS — R55 Syncope and collapse: Secondary | ICD-10-CM | POA: Diagnosis not present

## 2017-05-12 DIAGNOSIS — R41841 Cognitive communication deficit: Secondary | ICD-10-CM | POA: Diagnosis not present

## 2017-05-12 DIAGNOSIS — R278 Other lack of coordination: Secondary | ICD-10-CM | POA: Diagnosis not present

## 2017-05-12 DIAGNOSIS — Q238 Other congenital malformations of aortic and mitral valves: Secondary | ICD-10-CM | POA: Diagnosis not present

## 2017-05-12 DIAGNOSIS — I10 Essential (primary) hypertension: Secondary | ICD-10-CM | POA: Diagnosis not present

## 2017-05-12 DIAGNOSIS — J449 Chronic obstructive pulmonary disease, unspecified: Secondary | ICD-10-CM | POA: Diagnosis not present

## 2017-05-12 DIAGNOSIS — I4430 Unspecified atrioventricular block: Secondary | ICD-10-CM | POA: Diagnosis not present

## 2017-05-12 DIAGNOSIS — G309 Alzheimer's disease, unspecified: Secondary | ICD-10-CM | POA: Diagnosis not present

## 2017-05-12 DIAGNOSIS — G250 Essential tremor: Secondary | ICD-10-CM | POA: Diagnosis not present

## 2017-06-10 DIAGNOSIS — I4430 Unspecified atrioventricular block: Secondary | ICD-10-CM | POA: Diagnosis not present

## 2017-07-01 DIAGNOSIS — J441 Chronic obstructive pulmonary disease with (acute) exacerbation: Secondary | ICD-10-CM | POA: Diagnosis not present

## 2017-07-01 DIAGNOSIS — I10 Essential (primary) hypertension: Secondary | ICD-10-CM | POA: Diagnosis not present

## 2017-07-01 DIAGNOSIS — G308 Other Alzheimer's disease: Secondary | ICD-10-CM | POA: Diagnosis not present

## 2017-07-06 DIAGNOSIS — J449 Chronic obstructive pulmonary disease, unspecified: Secondary | ICD-10-CM | POA: Diagnosis not present

## 2017-07-06 DIAGNOSIS — F0280 Dementia in other diseases classified elsewhere without behavioral disturbance: Secondary | ICD-10-CM | POA: Diagnosis not present

## 2017-07-06 DIAGNOSIS — I495 Sick sinus syndrome: Secondary | ICD-10-CM | POA: Diagnosis not present

## 2017-07-06 DIAGNOSIS — I2510 Atherosclerotic heart disease of native coronary artery without angina pectoris: Secondary | ICD-10-CM | POA: Diagnosis not present

## 2017-07-06 DIAGNOSIS — F329 Major depressive disorder, single episode, unspecified: Secondary | ICD-10-CM | POA: Diagnosis not present

## 2017-07-06 DIAGNOSIS — G301 Alzheimer's disease with late onset: Secondary | ICD-10-CM | POA: Diagnosis not present

## 2017-07-06 DIAGNOSIS — R55 Syncope and collapse: Secondary | ICD-10-CM | POA: Diagnosis not present

## 2017-07-06 DIAGNOSIS — I350 Nonrheumatic aortic (valve) stenosis: Secondary | ICD-10-CM | POA: Diagnosis not present

## 2017-07-06 DIAGNOSIS — Z7951 Long term (current) use of inhaled steroids: Secondary | ICD-10-CM | POA: Diagnosis not present

## 2017-07-06 DIAGNOSIS — G308 Other Alzheimer's disease: Secondary | ICD-10-CM | POA: Diagnosis not present

## 2017-07-06 DIAGNOSIS — G250 Essential tremor: Secondary | ICD-10-CM | POA: Diagnosis not present

## 2017-07-06 DIAGNOSIS — I10 Essential (primary) hypertension: Secondary | ICD-10-CM | POA: Diagnosis not present

## 2017-07-06 DIAGNOSIS — Z742 Need for assistance at home and no other household member able to render care: Secondary | ICD-10-CM | POA: Diagnosis not present

## 2017-07-06 DIAGNOSIS — Z7982 Long term (current) use of aspirin: Secondary | ICD-10-CM | POA: Diagnosis not present

## 2017-07-07 DIAGNOSIS — J449 Chronic obstructive pulmonary disease, unspecified: Secondary | ICD-10-CM | POA: Diagnosis not present

## 2017-07-07 DIAGNOSIS — G301 Alzheimer's disease with late onset: Secondary | ICD-10-CM | POA: Diagnosis not present

## 2017-07-07 DIAGNOSIS — I350 Nonrheumatic aortic (valve) stenosis: Secondary | ICD-10-CM | POA: Diagnosis not present

## 2017-07-07 DIAGNOSIS — I495 Sick sinus syndrome: Secondary | ICD-10-CM | POA: Diagnosis not present

## 2017-07-07 DIAGNOSIS — R55 Syncope and collapse: Secondary | ICD-10-CM | POA: Diagnosis not present

## 2017-07-07 DIAGNOSIS — F0280 Dementia in other diseases classified elsewhere without behavioral disturbance: Secondary | ICD-10-CM | POA: Diagnosis not present

## 2017-07-08 DIAGNOSIS — F0280 Dementia in other diseases classified elsewhere without behavioral disturbance: Secondary | ICD-10-CM | POA: Diagnosis not present

## 2017-07-08 DIAGNOSIS — I350 Nonrheumatic aortic (valve) stenosis: Secondary | ICD-10-CM | POA: Diagnosis not present

## 2017-07-08 DIAGNOSIS — I495 Sick sinus syndrome: Secondary | ICD-10-CM | POA: Diagnosis not present

## 2017-07-08 DIAGNOSIS — R55 Syncope and collapse: Secondary | ICD-10-CM | POA: Diagnosis not present

## 2017-07-08 DIAGNOSIS — G301 Alzheimer's disease with late onset: Secondary | ICD-10-CM | POA: Diagnosis not present

## 2017-07-08 DIAGNOSIS — J449 Chronic obstructive pulmonary disease, unspecified: Secondary | ICD-10-CM | POA: Diagnosis not present

## 2017-07-09 DIAGNOSIS — J449 Chronic obstructive pulmonary disease, unspecified: Secondary | ICD-10-CM | POA: Diagnosis not present

## 2017-07-09 DIAGNOSIS — F0280 Dementia in other diseases classified elsewhere without behavioral disturbance: Secondary | ICD-10-CM | POA: Diagnosis not present

## 2017-07-09 DIAGNOSIS — I495 Sick sinus syndrome: Secondary | ICD-10-CM | POA: Diagnosis not present

## 2017-07-09 DIAGNOSIS — I350 Nonrheumatic aortic (valve) stenosis: Secondary | ICD-10-CM | POA: Diagnosis not present

## 2017-07-09 DIAGNOSIS — R55 Syncope and collapse: Secondary | ICD-10-CM | POA: Diagnosis not present

## 2017-07-09 DIAGNOSIS — G301 Alzheimer's disease with late onset: Secondary | ICD-10-CM | POA: Diagnosis not present

## 2017-07-13 DIAGNOSIS — J441 Chronic obstructive pulmonary disease with (acute) exacerbation: Secondary | ICD-10-CM | POA: Diagnosis not present

## 2017-07-13 DIAGNOSIS — J449 Chronic obstructive pulmonary disease, unspecified: Secondary | ICD-10-CM | POA: Diagnosis not present

## 2017-07-13 DIAGNOSIS — G301 Alzheimer's disease with late onset: Secondary | ICD-10-CM | POA: Diagnosis not present

## 2017-07-13 DIAGNOSIS — I495 Sick sinus syndrome: Secondary | ICD-10-CM | POA: Diagnosis not present

## 2017-07-13 DIAGNOSIS — F0280 Dementia in other diseases classified elsewhere without behavioral disturbance: Secondary | ICD-10-CM | POA: Diagnosis not present

## 2017-07-13 DIAGNOSIS — E784 Other hyperlipidemia: Secondary | ICD-10-CM | POA: Diagnosis not present

## 2017-07-13 DIAGNOSIS — I350 Nonrheumatic aortic (valve) stenosis: Secondary | ICD-10-CM | POA: Diagnosis not present

## 2017-07-13 DIAGNOSIS — R55 Syncope and collapse: Secondary | ICD-10-CM | POA: Diagnosis not present

## 2017-07-13 DIAGNOSIS — K219 Gastro-esophageal reflux disease without esophagitis: Secondary | ICD-10-CM | POA: Diagnosis not present

## 2017-07-14 DIAGNOSIS — F0280 Dementia in other diseases classified elsewhere without behavioral disturbance: Secondary | ICD-10-CM | POA: Diagnosis not present

## 2017-07-14 DIAGNOSIS — I350 Nonrheumatic aortic (valve) stenosis: Secondary | ICD-10-CM | POA: Diagnosis not present

## 2017-07-14 DIAGNOSIS — J449 Chronic obstructive pulmonary disease, unspecified: Secondary | ICD-10-CM | POA: Diagnosis not present

## 2017-07-14 DIAGNOSIS — G301 Alzheimer's disease with late onset: Secondary | ICD-10-CM | POA: Diagnosis not present

## 2017-07-14 DIAGNOSIS — I495 Sick sinus syndrome: Secondary | ICD-10-CM | POA: Diagnosis not present

## 2017-07-14 DIAGNOSIS — R55 Syncope and collapse: Secondary | ICD-10-CM | POA: Diagnosis not present

## 2017-07-15 DIAGNOSIS — F0280 Dementia in other diseases classified elsewhere without behavioral disturbance: Secondary | ICD-10-CM | POA: Diagnosis not present

## 2017-07-15 DIAGNOSIS — G301 Alzheimer's disease with late onset: Secondary | ICD-10-CM | POA: Diagnosis not present

## 2017-07-15 DIAGNOSIS — J449 Chronic obstructive pulmonary disease, unspecified: Secondary | ICD-10-CM | POA: Diagnosis not present

## 2017-07-15 DIAGNOSIS — I495 Sick sinus syndrome: Secondary | ICD-10-CM | POA: Diagnosis not present

## 2017-07-15 DIAGNOSIS — I350 Nonrheumatic aortic (valve) stenosis: Secondary | ICD-10-CM | POA: Diagnosis not present

## 2017-07-15 DIAGNOSIS — R55 Syncope and collapse: Secondary | ICD-10-CM | POA: Diagnosis not present

## 2017-07-16 DIAGNOSIS — I495 Sick sinus syndrome: Secondary | ICD-10-CM | POA: Diagnosis not present

## 2017-07-16 DIAGNOSIS — I350 Nonrheumatic aortic (valve) stenosis: Secondary | ICD-10-CM | POA: Diagnosis not present

## 2017-07-16 DIAGNOSIS — G301 Alzheimer's disease with late onset: Secondary | ICD-10-CM | POA: Diagnosis not present

## 2017-07-16 DIAGNOSIS — F0280 Dementia in other diseases classified elsewhere without behavioral disturbance: Secondary | ICD-10-CM | POA: Diagnosis not present

## 2017-07-16 DIAGNOSIS — R55 Syncope and collapse: Secondary | ICD-10-CM | POA: Diagnosis not present

## 2017-07-16 DIAGNOSIS — J449 Chronic obstructive pulmonary disease, unspecified: Secondary | ICD-10-CM | POA: Diagnosis not present

## 2017-07-20 DIAGNOSIS — G301 Alzheimer's disease with late onset: Secondary | ICD-10-CM | POA: Diagnosis not present

## 2017-07-20 DIAGNOSIS — F0280 Dementia in other diseases classified elsewhere without behavioral disturbance: Secondary | ICD-10-CM | POA: Diagnosis not present

## 2017-07-20 DIAGNOSIS — R55 Syncope and collapse: Secondary | ICD-10-CM | POA: Diagnosis not present

## 2017-07-20 DIAGNOSIS — I350 Nonrheumatic aortic (valve) stenosis: Secondary | ICD-10-CM | POA: Diagnosis not present

## 2017-07-20 DIAGNOSIS — J449 Chronic obstructive pulmonary disease, unspecified: Secondary | ICD-10-CM | POA: Diagnosis not present

## 2017-07-20 DIAGNOSIS — I495 Sick sinus syndrome: Secondary | ICD-10-CM | POA: Diagnosis not present

## 2017-08-06 DIAGNOSIS — R55 Syncope and collapse: Secondary | ICD-10-CM | POA: Diagnosis not present

## 2017-08-06 DIAGNOSIS — F0280 Dementia in other diseases classified elsewhere without behavioral disturbance: Secondary | ICD-10-CM | POA: Diagnosis not present

## 2017-08-06 DIAGNOSIS — J449 Chronic obstructive pulmonary disease, unspecified: Secondary | ICD-10-CM | POA: Diagnosis not present

## 2017-08-06 DIAGNOSIS — I350 Nonrheumatic aortic (valve) stenosis: Secondary | ICD-10-CM | POA: Diagnosis not present

## 2017-08-06 DIAGNOSIS — G301 Alzheimer's disease with late onset: Secondary | ICD-10-CM | POA: Diagnosis not present

## 2017-08-06 DIAGNOSIS — I495 Sick sinus syndrome: Secondary | ICD-10-CM | POA: Diagnosis not present

## 2017-08-18 DIAGNOSIS — I495 Sick sinus syndrome: Secondary | ICD-10-CM | POA: Diagnosis not present

## 2017-08-18 DIAGNOSIS — R55 Syncope and collapse: Secondary | ICD-10-CM | POA: Diagnosis not present

## 2017-08-18 DIAGNOSIS — J449 Chronic obstructive pulmonary disease, unspecified: Secondary | ICD-10-CM | POA: Diagnosis not present

## 2017-08-18 DIAGNOSIS — G301 Alzheimer's disease with late onset: Secondary | ICD-10-CM | POA: Diagnosis not present

## 2017-08-18 DIAGNOSIS — F0280 Dementia in other diseases classified elsewhere without behavioral disturbance: Secondary | ICD-10-CM | POA: Diagnosis not present

## 2017-08-18 DIAGNOSIS — I350 Nonrheumatic aortic (valve) stenosis: Secondary | ICD-10-CM | POA: Diagnosis not present

## 2017-09-03 DIAGNOSIS — R55 Syncope and collapse: Secondary | ICD-10-CM | POA: Diagnosis not present

## 2017-09-03 DIAGNOSIS — I495 Sick sinus syndrome: Secondary | ICD-10-CM | POA: Diagnosis not present

## 2017-09-03 DIAGNOSIS — J449 Chronic obstructive pulmonary disease, unspecified: Secondary | ICD-10-CM | POA: Diagnosis not present

## 2017-09-03 DIAGNOSIS — I350 Nonrheumatic aortic (valve) stenosis: Secondary | ICD-10-CM | POA: Diagnosis not present

## 2017-09-03 DIAGNOSIS — F0280 Dementia in other diseases classified elsewhere without behavioral disturbance: Secondary | ICD-10-CM | POA: Diagnosis not present

## 2017-09-03 DIAGNOSIS — G301 Alzheimer's disease with late onset: Secondary | ICD-10-CM | POA: Diagnosis not present

## 2017-09-25 DIAGNOSIS — K219 Gastro-esophageal reflux disease without esophagitis: Secondary | ICD-10-CM | POA: Diagnosis not present

## 2017-09-25 DIAGNOSIS — J441 Chronic obstructive pulmonary disease with (acute) exacerbation: Secondary | ICD-10-CM | POA: Diagnosis not present

## 2017-09-25 DIAGNOSIS — F0280 Dementia in other diseases classified elsewhere without behavioral disturbance: Secondary | ICD-10-CM | POA: Diagnosis not present

## 2017-09-25 DIAGNOSIS — E784 Other hyperlipidemia: Secondary | ICD-10-CM | POA: Diagnosis not present

## 2017-12-28 DIAGNOSIS — J441 Chronic obstructive pulmonary disease with (acute) exacerbation: Secondary | ICD-10-CM | POA: Diagnosis not present

## 2017-12-28 DIAGNOSIS — K219 Gastro-esophageal reflux disease without esophagitis: Secondary | ICD-10-CM | POA: Diagnosis not present

## 2017-12-28 DIAGNOSIS — E7849 Other hyperlipidemia: Secondary | ICD-10-CM | POA: Diagnosis not present

## 2018-03-05 DIAGNOSIS — E860 Dehydration: Secondary | ICD-10-CM | POA: Diagnosis not present

## 2018-03-05 DIAGNOSIS — W19XXXA Unspecified fall, initial encounter: Secondary | ICD-10-CM | POA: Diagnosis not present

## 2018-03-05 DIAGNOSIS — R531 Weakness: Secondary | ICD-10-CM | POA: Diagnosis not present

## 2018-03-05 DIAGNOSIS — Z9181 History of falling: Secondary | ICD-10-CM | POA: Diagnosis not present

## 2018-03-05 DIAGNOSIS — F0150 Vascular dementia without behavioral disturbance: Secondary | ICD-10-CM | POA: Diagnosis not present

## 2018-03-05 DIAGNOSIS — R404 Transient alteration of awareness: Secondary | ICD-10-CM | POA: Diagnosis not present

## 2018-03-05 DIAGNOSIS — R2681 Unsteadiness on feet: Secondary | ICD-10-CM | POA: Diagnosis not present

## 2018-03-05 DIAGNOSIS — S4992XA Unspecified injury of left shoulder and upper arm, initial encounter: Secondary | ICD-10-CM | POA: Diagnosis not present

## 2018-03-05 DIAGNOSIS — R42 Dizziness and giddiness: Secondary | ICD-10-CM | POA: Diagnosis not present

## 2018-03-05 DIAGNOSIS — R262 Difficulty in walking, not elsewhere classified: Secondary | ICD-10-CM | POA: Diagnosis not present

## 2018-03-05 DIAGNOSIS — M6281 Muscle weakness (generalized): Secondary | ICD-10-CM | POA: Diagnosis not present

## 2018-03-05 DIAGNOSIS — R55 Syncope and collapse: Secondary | ICD-10-CM | POA: Diagnosis not present

## 2018-03-06 DIAGNOSIS — E860 Dehydration: Secondary | ICD-10-CM | POA: Diagnosis not present

## 2018-03-06 DIAGNOSIS — E785 Hyperlipidemia, unspecified: Secondary | ICD-10-CM | POA: Diagnosis present

## 2018-03-06 DIAGNOSIS — R2689 Other abnormalities of gait and mobility: Secondary | ICD-10-CM | POA: Diagnosis not present

## 2018-03-06 DIAGNOSIS — R531 Weakness: Secondary | ICD-10-CM | POA: Diagnosis not present

## 2018-03-06 DIAGNOSIS — Z7951 Long term (current) use of inhaled steroids: Secondary | ICD-10-CM | POA: Diagnosis not present

## 2018-03-06 DIAGNOSIS — Z79899 Other long term (current) drug therapy: Secondary | ICD-10-CM | POA: Diagnosis not present

## 2018-03-06 DIAGNOSIS — Z8673 Personal history of transient ischemic attack (TIA), and cerebral infarction without residual deficits: Secondary | ICD-10-CM | POA: Diagnosis not present

## 2018-03-06 DIAGNOSIS — M6281 Muscle weakness (generalized): Secondary | ICD-10-CM | POA: Diagnosis not present

## 2018-03-06 DIAGNOSIS — F0280 Dementia in other diseases classified elsewhere without behavioral disturbance: Secondary | ICD-10-CM | POA: Diagnosis not present

## 2018-03-06 DIAGNOSIS — I2510 Atherosclerotic heart disease of native coronary artery without angina pectoris: Secondary | ICD-10-CM | POA: Diagnosis not present

## 2018-03-06 DIAGNOSIS — Z825 Family history of asthma and other chronic lower respiratory diseases: Secondary | ICD-10-CM | POA: Diagnosis not present

## 2018-03-06 DIAGNOSIS — Z87891 Personal history of nicotine dependence: Secondary | ICD-10-CM | POA: Diagnosis not present

## 2018-03-06 DIAGNOSIS — J449 Chronic obstructive pulmonary disease, unspecified: Secondary | ICD-10-CM | POA: Diagnosis present

## 2018-03-06 DIAGNOSIS — R630 Anorexia: Secondary | ICD-10-CM | POA: Diagnosis not present

## 2018-03-06 DIAGNOSIS — R262 Difficulty in walking, not elsewhere classified: Secondary | ICD-10-CM | POA: Diagnosis not present

## 2018-03-06 DIAGNOSIS — Z8249 Family history of ischemic heart disease and other diseases of the circulatory system: Secondary | ICD-10-CM | POA: Diagnosis not present

## 2018-03-06 DIAGNOSIS — R55 Syncope and collapse: Secondary | ICD-10-CM | POA: Diagnosis not present

## 2018-03-06 DIAGNOSIS — Z7982 Long term (current) use of aspirin: Secondary | ICD-10-CM | POA: Diagnosis not present

## 2018-03-06 DIAGNOSIS — F0150 Vascular dementia without behavioral disturbance: Secondary | ICD-10-CM | POA: Diagnosis not present

## 2018-03-06 DIAGNOSIS — K219 Gastro-esophageal reflux disease without esophagitis: Secondary | ICD-10-CM | POA: Diagnosis not present

## 2018-03-06 DIAGNOSIS — Z809 Family history of malignant neoplasm, unspecified: Secondary | ICD-10-CM | POA: Diagnosis not present

## 2018-03-06 DIAGNOSIS — F329 Major depressive disorder, single episode, unspecified: Secondary | ICD-10-CM | POA: Diagnosis present

## 2018-03-06 DIAGNOSIS — Z955 Presence of coronary angioplasty implant and graft: Secondary | ICD-10-CM | POA: Diagnosis not present

## 2018-03-06 DIAGNOSIS — I10 Essential (primary) hypertension: Secondary | ICD-10-CM | POA: Diagnosis not present

## 2018-03-06 DIAGNOSIS — Z9181 History of falling: Secondary | ICD-10-CM | POA: Diagnosis not present

## 2018-03-09 DIAGNOSIS — R262 Difficulty in walking, not elsewhere classified: Secondary | ICD-10-CM | POA: Diagnosis not present

## 2018-03-09 DIAGNOSIS — M6281 Muscle weakness (generalized): Secondary | ICD-10-CM | POA: Diagnosis not present

## 2018-03-09 DIAGNOSIS — I2510 Atherosclerotic heart disease of native coronary artery without angina pectoris: Secondary | ICD-10-CM | POA: Diagnosis not present

## 2018-03-09 DIAGNOSIS — F329 Major depressive disorder, single episode, unspecified: Secondary | ICD-10-CM | POA: Diagnosis not present

## 2018-03-09 DIAGNOSIS — R2689 Other abnormalities of gait and mobility: Secondary | ICD-10-CM | POA: Diagnosis not present

## 2018-03-09 DIAGNOSIS — I10 Essential (primary) hypertension: Secondary | ICD-10-CM | POA: Diagnosis not present

## 2018-03-09 DIAGNOSIS — R079 Chest pain, unspecified: Secondary | ICD-10-CM | POA: Diagnosis not present

## 2018-03-09 DIAGNOSIS — R55 Syncope and collapse: Secondary | ICD-10-CM | POA: Diagnosis not present

## 2018-03-09 DIAGNOSIS — J449 Chronic obstructive pulmonary disease, unspecified: Secondary | ICD-10-CM | POA: Diagnosis not present

## 2018-03-09 DIAGNOSIS — R531 Weakness: Secondary | ICD-10-CM | POA: Diagnosis not present

## 2018-03-09 DIAGNOSIS — R630 Anorexia: Secondary | ICD-10-CM | POA: Diagnosis not present

## 2018-03-09 DIAGNOSIS — E785 Hyperlipidemia, unspecified: Secondary | ICD-10-CM | POA: Diagnosis not present

## 2018-03-09 DIAGNOSIS — F0280 Dementia in other diseases classified elsewhere without behavioral disturbance: Secondary | ICD-10-CM | POA: Diagnosis not present

## 2018-03-09 DIAGNOSIS — Z9181 History of falling: Secondary | ICD-10-CM | POA: Diagnosis not present

## 2018-03-09 DIAGNOSIS — E860 Dehydration: Secondary | ICD-10-CM | POA: Diagnosis not present

## 2018-03-09 DIAGNOSIS — F0150 Vascular dementia without behavioral disturbance: Secondary | ICD-10-CM | POA: Diagnosis not present

## 2018-03-09 DIAGNOSIS — K219 Gastro-esophageal reflux disease without esophagitis: Secondary | ICD-10-CM | POA: Diagnosis not present

## 2018-04-28 DIAGNOSIS — R531 Weakness: Secondary | ICD-10-CM | POA: Diagnosis not present

## 2018-04-28 DIAGNOSIS — F0280 Dementia in other diseases classified elsewhere without behavioral disturbance: Secondary | ICD-10-CM | POA: Diagnosis not present

## 2018-04-28 DIAGNOSIS — E860 Dehydration: Secondary | ICD-10-CM | POA: Diagnosis not present

## 2018-04-28 DIAGNOSIS — I10 Essential (primary) hypertension: Secondary | ICD-10-CM | POA: Diagnosis not present

## 2018-04-30 DIAGNOSIS — Z79899 Other long term (current) drug therapy: Secondary | ICD-10-CM | POA: Diagnosis not present

## 2018-04-30 DIAGNOSIS — Z7982 Long term (current) use of aspirin: Secondary | ICD-10-CM | POA: Diagnosis not present

## 2018-04-30 DIAGNOSIS — Z5181 Encounter for therapeutic drug level monitoring: Secondary | ICD-10-CM | POA: Diagnosis not present

## 2018-06-11 DIAGNOSIS — F0280 Dementia in other diseases classified elsewhere without behavioral disturbance: Secondary | ICD-10-CM | POA: Diagnosis not present

## 2018-06-11 DIAGNOSIS — I10 Essential (primary) hypertension: Secondary | ICD-10-CM | POA: Diagnosis not present

## 2018-06-11 DIAGNOSIS — E860 Dehydration: Secondary | ICD-10-CM | POA: Diagnosis not present

## 2018-06-11 DIAGNOSIS — R531 Weakness: Secondary | ICD-10-CM | POA: Diagnosis not present

## 2018-06-14 DIAGNOSIS — Z79899 Other long term (current) drug therapy: Secondary | ICD-10-CM | POA: Diagnosis not present

## 2018-06-14 DIAGNOSIS — Z5181 Encounter for therapeutic drug level monitoring: Secondary | ICD-10-CM | POA: Diagnosis not present

## 2018-07-17 DIAGNOSIS — F0280 Dementia in other diseases classified elsewhere without behavioral disturbance: Secondary | ICD-10-CM | POA: Diagnosis not present

## 2018-07-17 DIAGNOSIS — I10 Essential (primary) hypertension: Secondary | ICD-10-CM | POA: Diagnosis not present

## 2018-07-17 DIAGNOSIS — E860 Dehydration: Secondary | ICD-10-CM | POA: Diagnosis not present

## 2018-07-17 DIAGNOSIS — R531 Weakness: Secondary | ICD-10-CM | POA: Diagnosis not present

## 2018-07-20 DIAGNOSIS — B351 Tinea unguium: Secondary | ICD-10-CM | POA: Diagnosis not present

## 2018-07-20 DIAGNOSIS — R262 Difficulty in walking, not elsewhere classified: Secondary | ICD-10-CM | POA: Diagnosis not present

## 2018-07-20 DIAGNOSIS — I739 Peripheral vascular disease, unspecified: Secondary | ICD-10-CM | POA: Diagnosis not present

## 2018-08-02 DIAGNOSIS — R296 Repeated falls: Secondary | ICD-10-CM | POA: Diagnosis not present

## 2018-08-02 DIAGNOSIS — M6281 Muscle weakness (generalized): Secondary | ICD-10-CM | POA: Diagnosis not present

## 2018-08-03 DIAGNOSIS — M6281 Muscle weakness (generalized): Secondary | ICD-10-CM | POA: Diagnosis not present

## 2018-08-03 DIAGNOSIS — R296 Repeated falls: Secondary | ICD-10-CM | POA: Diagnosis not present

## 2018-08-04 DIAGNOSIS — R296 Repeated falls: Secondary | ICD-10-CM | POA: Diagnosis not present

## 2018-08-04 DIAGNOSIS — M6281 Muscle weakness (generalized): Secondary | ICD-10-CM | POA: Diagnosis not present

## 2018-08-05 DIAGNOSIS — R296 Repeated falls: Secondary | ICD-10-CM | POA: Diagnosis not present

## 2018-08-05 DIAGNOSIS — M6281 Muscle weakness (generalized): Secondary | ICD-10-CM | POA: Diagnosis not present

## 2018-08-06 DIAGNOSIS — M6281 Muscle weakness (generalized): Secondary | ICD-10-CM | POA: Diagnosis not present

## 2018-08-06 DIAGNOSIS — R296 Repeated falls: Secondary | ICD-10-CM | POA: Diagnosis not present

## 2018-08-09 DIAGNOSIS — R296 Repeated falls: Secondary | ICD-10-CM | POA: Diagnosis not present

## 2018-08-09 DIAGNOSIS — M6281 Muscle weakness (generalized): Secondary | ICD-10-CM | POA: Diagnosis not present

## 2018-08-10 DIAGNOSIS — M6281 Muscle weakness (generalized): Secondary | ICD-10-CM | POA: Diagnosis not present

## 2018-08-10 DIAGNOSIS — R296 Repeated falls: Secondary | ICD-10-CM | POA: Diagnosis not present

## 2018-08-11 DIAGNOSIS — R296 Repeated falls: Secondary | ICD-10-CM | POA: Diagnosis not present

## 2018-08-11 DIAGNOSIS — M6281 Muscle weakness (generalized): Secondary | ICD-10-CM | POA: Diagnosis not present

## 2018-08-12 DIAGNOSIS — R296 Repeated falls: Secondary | ICD-10-CM | POA: Diagnosis not present

## 2018-08-12 DIAGNOSIS — M6281 Muscle weakness (generalized): Secondary | ICD-10-CM | POA: Diagnosis not present

## 2018-08-13 DIAGNOSIS — M6281 Muscle weakness (generalized): Secondary | ICD-10-CM | POA: Diagnosis not present

## 2018-08-13 DIAGNOSIS — R296 Repeated falls: Secondary | ICD-10-CM | POA: Diagnosis not present

## 2018-08-16 DIAGNOSIS — R296 Repeated falls: Secondary | ICD-10-CM | POA: Diagnosis not present

## 2018-08-16 DIAGNOSIS — M6281 Muscle weakness (generalized): Secondary | ICD-10-CM | POA: Diagnosis not present

## 2018-08-17 DIAGNOSIS — M6281 Muscle weakness (generalized): Secondary | ICD-10-CM | POA: Diagnosis not present

## 2018-08-17 DIAGNOSIS — R296 Repeated falls: Secondary | ICD-10-CM | POA: Diagnosis not present

## 2018-08-18 DIAGNOSIS — R296 Repeated falls: Secondary | ICD-10-CM | POA: Diagnosis not present

## 2018-08-18 DIAGNOSIS — M6281 Muscle weakness (generalized): Secondary | ICD-10-CM | POA: Diagnosis not present

## 2018-08-19 DIAGNOSIS — M6281 Muscle weakness (generalized): Secondary | ICD-10-CM | POA: Diagnosis not present

## 2018-08-19 DIAGNOSIS — R296 Repeated falls: Secondary | ICD-10-CM | POA: Diagnosis not present

## 2018-08-20 DIAGNOSIS — R296 Repeated falls: Secondary | ICD-10-CM | POA: Diagnosis not present

## 2018-08-20 DIAGNOSIS — M6281 Muscle weakness (generalized): Secondary | ICD-10-CM | POA: Diagnosis not present

## 2018-08-23 DIAGNOSIS — R296 Repeated falls: Secondary | ICD-10-CM | POA: Diagnosis not present

## 2018-08-23 DIAGNOSIS — M6281 Muscle weakness (generalized): Secondary | ICD-10-CM | POA: Diagnosis not present

## 2018-08-24 DIAGNOSIS — R296 Repeated falls: Secondary | ICD-10-CM | POA: Diagnosis not present

## 2018-08-24 DIAGNOSIS — M6281 Muscle weakness (generalized): Secondary | ICD-10-CM | POA: Diagnosis not present

## 2018-08-25 DIAGNOSIS — H26493 Other secondary cataract, bilateral: Secondary | ICD-10-CM | POA: Diagnosis not present

## 2018-08-25 DIAGNOSIS — Z961 Presence of intraocular lens: Secondary | ICD-10-CM | POA: Diagnosis not present

## 2018-08-25 DIAGNOSIS — R296 Repeated falls: Secondary | ICD-10-CM | POA: Diagnosis not present

## 2018-08-25 DIAGNOSIS — M6281 Muscle weakness (generalized): Secondary | ICD-10-CM | POA: Diagnosis not present

## 2018-08-25 DIAGNOSIS — Z7951 Long term (current) use of inhaled steroids: Secondary | ICD-10-CM | POA: Diagnosis not present

## 2018-08-26 DIAGNOSIS — R296 Repeated falls: Secondary | ICD-10-CM | POA: Diagnosis not present

## 2018-08-26 DIAGNOSIS — M6281 Muscle weakness (generalized): Secondary | ICD-10-CM | POA: Diagnosis not present

## 2018-08-27 DIAGNOSIS — M6281 Muscle weakness (generalized): Secondary | ICD-10-CM | POA: Diagnosis not present

## 2018-08-27 DIAGNOSIS — R296 Repeated falls: Secondary | ICD-10-CM | POA: Diagnosis not present

## 2018-08-30 DIAGNOSIS — M6281 Muscle weakness (generalized): Secondary | ICD-10-CM | POA: Diagnosis not present

## 2018-08-30 DIAGNOSIS — R296 Repeated falls: Secondary | ICD-10-CM | POA: Diagnosis not present

## 2018-08-31 DIAGNOSIS — R296 Repeated falls: Secondary | ICD-10-CM | POA: Diagnosis not present

## 2018-08-31 DIAGNOSIS — M6281 Muscle weakness (generalized): Secondary | ICD-10-CM | POA: Diagnosis not present

## 2018-09-01 DIAGNOSIS — R296 Repeated falls: Secondary | ICD-10-CM | POA: Diagnosis not present

## 2018-09-01 DIAGNOSIS — M6281 Muscle weakness (generalized): Secondary | ICD-10-CM | POA: Diagnosis not present

## 2018-09-02 DIAGNOSIS — R296 Repeated falls: Secondary | ICD-10-CM | POA: Diagnosis not present

## 2018-09-02 DIAGNOSIS — M6281 Muscle weakness (generalized): Secondary | ICD-10-CM | POA: Diagnosis not present

## 2018-09-03 DIAGNOSIS — R296 Repeated falls: Secondary | ICD-10-CM | POA: Diagnosis not present

## 2018-09-03 DIAGNOSIS — M6281 Muscle weakness (generalized): Secondary | ICD-10-CM | POA: Diagnosis not present

## 2018-09-03 DIAGNOSIS — E860 Dehydration: Secondary | ICD-10-CM | POA: Diagnosis not present

## 2018-09-03 DIAGNOSIS — R531 Weakness: Secondary | ICD-10-CM | POA: Diagnosis not present

## 2018-09-03 DIAGNOSIS — F0280 Dementia in other diseases classified elsewhere without behavioral disturbance: Secondary | ICD-10-CM | POA: Diagnosis not present

## 2018-09-03 DIAGNOSIS — I10 Essential (primary) hypertension: Secondary | ICD-10-CM | POA: Diagnosis not present

## 2018-09-06 DIAGNOSIS — R296 Repeated falls: Secondary | ICD-10-CM | POA: Diagnosis not present

## 2018-09-06 DIAGNOSIS — M6281 Muscle weakness (generalized): Secondary | ICD-10-CM | POA: Diagnosis not present

## 2018-09-07 DIAGNOSIS — R296 Repeated falls: Secondary | ICD-10-CM | POA: Diagnosis not present

## 2018-09-07 DIAGNOSIS — M6281 Muscle weakness (generalized): Secondary | ICD-10-CM | POA: Diagnosis not present

## 2018-09-08 DIAGNOSIS — M6281 Muscle weakness (generalized): Secondary | ICD-10-CM | POA: Diagnosis not present

## 2018-09-08 DIAGNOSIS — R296 Repeated falls: Secondary | ICD-10-CM | POA: Diagnosis not present

## 2018-09-09 DIAGNOSIS — M6281 Muscle weakness (generalized): Secondary | ICD-10-CM | POA: Diagnosis not present

## 2018-09-09 DIAGNOSIS — R296 Repeated falls: Secondary | ICD-10-CM | POA: Diagnosis not present

## 2018-09-10 DIAGNOSIS — M6281 Muscle weakness (generalized): Secondary | ICD-10-CM | POA: Diagnosis not present

## 2018-09-10 DIAGNOSIS — R296 Repeated falls: Secondary | ICD-10-CM | POA: Diagnosis not present

## 2018-09-11 DIAGNOSIS — R296 Repeated falls: Secondary | ICD-10-CM | POA: Diagnosis not present

## 2018-09-11 DIAGNOSIS — M6281 Muscle weakness (generalized): Secondary | ICD-10-CM | POA: Diagnosis not present

## 2018-09-13 DIAGNOSIS — R296 Repeated falls: Secondary | ICD-10-CM | POA: Diagnosis not present

## 2018-09-13 DIAGNOSIS — M6281 Muscle weakness (generalized): Secondary | ICD-10-CM | POA: Diagnosis not present

## 2018-09-14 DIAGNOSIS — R296 Repeated falls: Secondary | ICD-10-CM | POA: Diagnosis not present

## 2018-09-14 DIAGNOSIS — M6281 Muscle weakness (generalized): Secondary | ICD-10-CM | POA: Diagnosis not present

## 2018-09-15 DIAGNOSIS — R296 Repeated falls: Secondary | ICD-10-CM | POA: Diagnosis not present

## 2018-09-15 DIAGNOSIS — M6281 Muscle weakness (generalized): Secondary | ICD-10-CM | POA: Diagnosis not present

## 2018-09-16 DIAGNOSIS — R296 Repeated falls: Secondary | ICD-10-CM | POA: Diagnosis not present

## 2018-09-16 DIAGNOSIS — M6281 Muscle weakness (generalized): Secondary | ICD-10-CM | POA: Diagnosis not present

## 2018-09-17 DIAGNOSIS — R296 Repeated falls: Secondary | ICD-10-CM | POA: Diagnosis not present

## 2018-09-17 DIAGNOSIS — M6281 Muscle weakness (generalized): Secondary | ICD-10-CM | POA: Diagnosis not present

## 2018-09-20 DIAGNOSIS — M6281 Muscle weakness (generalized): Secondary | ICD-10-CM | POA: Diagnosis not present

## 2018-09-20 DIAGNOSIS — R296 Repeated falls: Secondary | ICD-10-CM | POA: Diagnosis not present

## 2018-09-21 DIAGNOSIS — R296 Repeated falls: Secondary | ICD-10-CM | POA: Diagnosis not present

## 2018-09-21 DIAGNOSIS — M6281 Muscle weakness (generalized): Secondary | ICD-10-CM | POA: Diagnosis not present

## 2018-09-22 DIAGNOSIS — R296 Repeated falls: Secondary | ICD-10-CM | POA: Diagnosis not present

## 2018-09-22 DIAGNOSIS — M6281 Muscle weakness (generalized): Secondary | ICD-10-CM | POA: Diagnosis not present

## 2018-09-23 DIAGNOSIS — R296 Repeated falls: Secondary | ICD-10-CM | POA: Diagnosis not present

## 2018-09-23 DIAGNOSIS — M6281 Muscle weakness (generalized): Secondary | ICD-10-CM | POA: Diagnosis not present

## 2018-09-24 DIAGNOSIS — R296 Repeated falls: Secondary | ICD-10-CM | POA: Diagnosis not present

## 2018-09-24 DIAGNOSIS — M6281 Muscle weakness (generalized): Secondary | ICD-10-CM | POA: Diagnosis not present

## 2018-09-27 DIAGNOSIS — M6281 Muscle weakness (generalized): Secondary | ICD-10-CM | POA: Diagnosis not present

## 2018-09-27 DIAGNOSIS — R296 Repeated falls: Secondary | ICD-10-CM | POA: Diagnosis not present

## 2018-09-28 DIAGNOSIS — M6281 Muscle weakness (generalized): Secondary | ICD-10-CM | POA: Diagnosis not present

## 2018-09-28 DIAGNOSIS — R296 Repeated falls: Secondary | ICD-10-CM | POA: Diagnosis not present

## 2018-09-29 DIAGNOSIS — R296 Repeated falls: Secondary | ICD-10-CM | POA: Diagnosis not present

## 2018-09-29 DIAGNOSIS — M6281 Muscle weakness (generalized): Secondary | ICD-10-CM | POA: Diagnosis not present

## 2018-09-30 DIAGNOSIS — R296 Repeated falls: Secondary | ICD-10-CM | POA: Diagnosis not present

## 2018-09-30 DIAGNOSIS — M6281 Muscle weakness (generalized): Secondary | ICD-10-CM | POA: Diagnosis not present

## 2018-10-01 DIAGNOSIS — R296 Repeated falls: Secondary | ICD-10-CM | POA: Diagnosis not present

## 2018-10-01 DIAGNOSIS — M6281 Muscle weakness (generalized): Secondary | ICD-10-CM | POA: Diagnosis not present

## 2018-10-04 DIAGNOSIS — M6281 Muscle weakness (generalized): Secondary | ICD-10-CM | POA: Diagnosis not present

## 2018-10-04 DIAGNOSIS — R296 Repeated falls: Secondary | ICD-10-CM | POA: Diagnosis not present

## 2018-10-05 DIAGNOSIS — M6281 Muscle weakness (generalized): Secondary | ICD-10-CM | POA: Diagnosis not present

## 2018-10-05 DIAGNOSIS — R296 Repeated falls: Secondary | ICD-10-CM | POA: Diagnosis not present

## 2018-10-06 DIAGNOSIS — R296 Repeated falls: Secondary | ICD-10-CM | POA: Diagnosis not present

## 2018-10-06 DIAGNOSIS — M6281 Muscle weakness (generalized): Secondary | ICD-10-CM | POA: Diagnosis not present

## 2018-10-07 DIAGNOSIS — R296 Repeated falls: Secondary | ICD-10-CM | POA: Diagnosis not present

## 2018-10-07 DIAGNOSIS — M6281 Muscle weakness (generalized): Secondary | ICD-10-CM | POA: Diagnosis not present

## 2018-10-08 DIAGNOSIS — M6281 Muscle weakness (generalized): Secondary | ICD-10-CM | POA: Diagnosis not present

## 2018-10-08 DIAGNOSIS — R296 Repeated falls: Secondary | ICD-10-CM | POA: Diagnosis not present

## 2018-10-11 DIAGNOSIS — M6281 Muscle weakness (generalized): Secondary | ICD-10-CM | POA: Diagnosis not present

## 2018-10-11 DIAGNOSIS — R296 Repeated falls: Secondary | ICD-10-CM | POA: Diagnosis not present

## 2018-10-12 DIAGNOSIS — R296 Repeated falls: Secondary | ICD-10-CM | POA: Diagnosis not present

## 2018-10-12 DIAGNOSIS — M6281 Muscle weakness (generalized): Secondary | ICD-10-CM | POA: Diagnosis not present

## 2018-10-13 DIAGNOSIS — M6281 Muscle weakness (generalized): Secondary | ICD-10-CM | POA: Diagnosis not present

## 2018-10-13 DIAGNOSIS — R296 Repeated falls: Secondary | ICD-10-CM | POA: Diagnosis not present

## 2018-10-14 DIAGNOSIS — E860 Dehydration: Secondary | ICD-10-CM | POA: Diagnosis not present

## 2018-10-14 DIAGNOSIS — F0280 Dementia in other diseases classified elsewhere without behavioral disturbance: Secondary | ICD-10-CM | POA: Diagnosis not present

## 2018-10-14 DIAGNOSIS — R531 Weakness: Secondary | ICD-10-CM | POA: Diagnosis not present

## 2018-10-14 DIAGNOSIS — R296 Repeated falls: Secondary | ICD-10-CM | POA: Diagnosis not present

## 2018-10-14 DIAGNOSIS — M6281 Muscle weakness (generalized): Secondary | ICD-10-CM | POA: Diagnosis not present

## 2018-10-14 DIAGNOSIS — I10 Essential (primary) hypertension: Secondary | ICD-10-CM | POA: Diagnosis not present

## 2018-10-15 DIAGNOSIS — M6281 Muscle weakness (generalized): Secondary | ICD-10-CM | POA: Diagnosis not present

## 2018-10-15 DIAGNOSIS — R296 Repeated falls: Secondary | ICD-10-CM | POA: Diagnosis not present

## 2018-10-18 DIAGNOSIS — R296 Repeated falls: Secondary | ICD-10-CM | POA: Diagnosis not present

## 2018-10-18 DIAGNOSIS — M6281 Muscle weakness (generalized): Secondary | ICD-10-CM | POA: Diagnosis not present

## 2018-10-19 DIAGNOSIS — R296 Repeated falls: Secondary | ICD-10-CM | POA: Diagnosis not present

## 2018-10-19 DIAGNOSIS — M6281 Muscle weakness (generalized): Secondary | ICD-10-CM | POA: Diagnosis not present

## 2018-10-20 DIAGNOSIS — M6281 Muscle weakness (generalized): Secondary | ICD-10-CM | POA: Diagnosis not present

## 2018-10-20 DIAGNOSIS — R296 Repeated falls: Secondary | ICD-10-CM | POA: Diagnosis not present

## 2018-10-21 DIAGNOSIS — R296 Repeated falls: Secondary | ICD-10-CM | POA: Diagnosis not present

## 2018-10-21 DIAGNOSIS — M6281 Muscle weakness (generalized): Secondary | ICD-10-CM | POA: Diagnosis not present

## 2018-10-22 DIAGNOSIS — R296 Repeated falls: Secondary | ICD-10-CM | POA: Diagnosis not present

## 2018-10-22 DIAGNOSIS — M6281 Muscle weakness (generalized): Secondary | ICD-10-CM | POA: Diagnosis not present

## 2018-10-25 DIAGNOSIS — M6281 Muscle weakness (generalized): Secondary | ICD-10-CM | POA: Diagnosis not present

## 2018-10-25 DIAGNOSIS — R296 Repeated falls: Secondary | ICD-10-CM | POA: Diagnosis not present

## 2018-10-26 DIAGNOSIS — M6281 Muscle weakness (generalized): Secondary | ICD-10-CM | POA: Diagnosis not present

## 2018-10-26 DIAGNOSIS — R296 Repeated falls: Secondary | ICD-10-CM | POA: Diagnosis not present

## 2018-10-29 DIAGNOSIS — R41841 Cognitive communication deficit: Secondary | ICD-10-CM | POA: Diagnosis not present

## 2018-10-29 DIAGNOSIS — M6281 Muscle weakness (generalized): Secondary | ICD-10-CM | POA: Diagnosis not present

## 2018-10-29 DIAGNOSIS — R296 Repeated falls: Secondary | ICD-10-CM | POA: Diagnosis not present

## 2018-11-01 DIAGNOSIS — M6281 Muscle weakness (generalized): Secondary | ICD-10-CM | POA: Diagnosis not present

## 2018-11-01 DIAGNOSIS — R41841 Cognitive communication deficit: Secondary | ICD-10-CM | POA: Diagnosis not present

## 2018-11-01 DIAGNOSIS — R296 Repeated falls: Secondary | ICD-10-CM | POA: Diagnosis not present

## 2018-11-02 DIAGNOSIS — R296 Repeated falls: Secondary | ICD-10-CM | POA: Diagnosis not present

## 2018-11-02 DIAGNOSIS — R41841 Cognitive communication deficit: Secondary | ICD-10-CM | POA: Diagnosis not present

## 2018-11-02 DIAGNOSIS — M6281 Muscle weakness (generalized): Secondary | ICD-10-CM | POA: Diagnosis not present

## 2018-11-03 DIAGNOSIS — R296 Repeated falls: Secondary | ICD-10-CM | POA: Diagnosis not present

## 2018-11-03 DIAGNOSIS — M6281 Muscle weakness (generalized): Secondary | ICD-10-CM | POA: Diagnosis not present

## 2018-11-03 DIAGNOSIS — R41841 Cognitive communication deficit: Secondary | ICD-10-CM | POA: Diagnosis not present

## 2018-11-04 DIAGNOSIS — R296 Repeated falls: Secondary | ICD-10-CM | POA: Diagnosis not present

## 2018-11-04 DIAGNOSIS — M6281 Muscle weakness (generalized): Secondary | ICD-10-CM | POA: Diagnosis not present

## 2018-11-04 DIAGNOSIS — R41841 Cognitive communication deficit: Secondary | ICD-10-CM | POA: Diagnosis not present

## 2018-11-05 DIAGNOSIS — R296 Repeated falls: Secondary | ICD-10-CM | POA: Diagnosis not present

## 2018-11-05 DIAGNOSIS — R41841 Cognitive communication deficit: Secondary | ICD-10-CM | POA: Diagnosis not present

## 2018-11-05 DIAGNOSIS — M6281 Muscle weakness (generalized): Secondary | ICD-10-CM | POA: Diagnosis not present

## 2018-11-08 DIAGNOSIS — M6281 Muscle weakness (generalized): Secondary | ICD-10-CM | POA: Diagnosis not present

## 2018-11-08 DIAGNOSIS — R41841 Cognitive communication deficit: Secondary | ICD-10-CM | POA: Diagnosis not present

## 2018-11-08 DIAGNOSIS — R296 Repeated falls: Secondary | ICD-10-CM | POA: Diagnosis not present

## 2018-11-09 DIAGNOSIS — R41841 Cognitive communication deficit: Secondary | ICD-10-CM | POA: Diagnosis not present

## 2018-11-09 DIAGNOSIS — M6281 Muscle weakness (generalized): Secondary | ICD-10-CM | POA: Diagnosis not present

## 2018-11-09 DIAGNOSIS — R296 Repeated falls: Secondary | ICD-10-CM | POA: Diagnosis not present

## 2018-11-12 DIAGNOSIS — R296 Repeated falls: Secondary | ICD-10-CM | POA: Diagnosis not present

## 2018-11-12 DIAGNOSIS — M6281 Muscle weakness (generalized): Secondary | ICD-10-CM | POA: Diagnosis not present

## 2018-11-12 DIAGNOSIS — R41841 Cognitive communication deficit: Secondary | ICD-10-CM | POA: Diagnosis not present

## 2018-11-15 DIAGNOSIS — R296 Repeated falls: Secondary | ICD-10-CM | POA: Diagnosis not present

## 2018-11-15 DIAGNOSIS — M6281 Muscle weakness (generalized): Secondary | ICD-10-CM | POA: Diagnosis not present

## 2018-11-15 DIAGNOSIS — R41841 Cognitive communication deficit: Secondary | ICD-10-CM | POA: Diagnosis not present

## 2018-11-16 DIAGNOSIS — R41841 Cognitive communication deficit: Secondary | ICD-10-CM | POA: Diagnosis not present

## 2018-11-16 DIAGNOSIS — M6281 Muscle weakness (generalized): Secondary | ICD-10-CM | POA: Diagnosis not present

## 2018-11-16 DIAGNOSIS — R296 Repeated falls: Secondary | ICD-10-CM | POA: Diagnosis not present

## 2018-11-18 DIAGNOSIS — R296 Repeated falls: Secondary | ICD-10-CM | POA: Diagnosis not present

## 2018-11-18 DIAGNOSIS — R41841 Cognitive communication deficit: Secondary | ICD-10-CM | POA: Diagnosis not present

## 2018-11-18 DIAGNOSIS — M6281 Muscle weakness (generalized): Secondary | ICD-10-CM | POA: Diagnosis not present

## 2018-11-19 DIAGNOSIS — R296 Repeated falls: Secondary | ICD-10-CM | POA: Diagnosis not present

## 2018-11-19 DIAGNOSIS — R41841 Cognitive communication deficit: Secondary | ICD-10-CM | POA: Diagnosis not present

## 2018-11-19 DIAGNOSIS — M6281 Muscle weakness (generalized): Secondary | ICD-10-CM | POA: Diagnosis not present

## 2018-11-23 DIAGNOSIS — R296 Repeated falls: Secondary | ICD-10-CM | POA: Diagnosis not present

## 2018-11-23 DIAGNOSIS — B351 Tinea unguium: Secondary | ICD-10-CM | POA: Diagnosis not present

## 2018-11-23 DIAGNOSIS — R262 Difficulty in walking, not elsewhere classified: Secondary | ICD-10-CM | POA: Diagnosis not present

## 2018-11-23 DIAGNOSIS — I739 Peripheral vascular disease, unspecified: Secondary | ICD-10-CM | POA: Diagnosis not present

## 2018-11-23 DIAGNOSIS — M6281 Muscle weakness (generalized): Secondary | ICD-10-CM | POA: Diagnosis not present

## 2018-11-23 DIAGNOSIS — R41841 Cognitive communication deficit: Secondary | ICD-10-CM | POA: Diagnosis not present

## 2018-11-23 DIAGNOSIS — L603 Nail dystrophy: Secondary | ICD-10-CM | POA: Diagnosis not present

## 2018-11-25 DIAGNOSIS — R531 Weakness: Secondary | ICD-10-CM | POA: Diagnosis not present

## 2018-11-25 DIAGNOSIS — I10 Essential (primary) hypertension: Secondary | ICD-10-CM | POA: Diagnosis not present

## 2018-11-25 DIAGNOSIS — R296 Repeated falls: Secondary | ICD-10-CM | POA: Diagnosis not present

## 2018-11-25 DIAGNOSIS — R41841 Cognitive communication deficit: Secondary | ICD-10-CM | POA: Diagnosis not present

## 2018-11-25 DIAGNOSIS — F0280 Dementia in other diseases classified elsewhere without behavioral disturbance: Secondary | ICD-10-CM | POA: Diagnosis not present

## 2018-11-25 DIAGNOSIS — M6281 Muscle weakness (generalized): Secondary | ICD-10-CM | POA: Diagnosis not present

## 2018-11-25 DIAGNOSIS — E860 Dehydration: Secondary | ICD-10-CM | POA: Diagnosis not present

## 2018-11-26 DIAGNOSIS — R296 Repeated falls: Secondary | ICD-10-CM | POA: Diagnosis not present

## 2018-11-26 DIAGNOSIS — R41841 Cognitive communication deficit: Secondary | ICD-10-CM | POA: Diagnosis not present

## 2018-11-26 DIAGNOSIS — M6281 Muscle weakness (generalized): Secondary | ICD-10-CM | POA: Diagnosis not present

## 2018-11-29 DIAGNOSIS — R296 Repeated falls: Secondary | ICD-10-CM | POA: Diagnosis not present

## 2018-11-29 DIAGNOSIS — R41841 Cognitive communication deficit: Secondary | ICD-10-CM | POA: Diagnosis not present

## 2018-11-30 DIAGNOSIS — R296 Repeated falls: Secondary | ICD-10-CM | POA: Diagnosis not present

## 2018-11-30 DIAGNOSIS — R41841 Cognitive communication deficit: Secondary | ICD-10-CM | POA: Diagnosis not present

## 2018-12-01 DIAGNOSIS — R296 Repeated falls: Secondary | ICD-10-CM | POA: Diagnosis not present

## 2018-12-01 DIAGNOSIS — R41841 Cognitive communication deficit: Secondary | ICD-10-CM | POA: Diagnosis not present

## 2018-12-02 DIAGNOSIS — R41841 Cognitive communication deficit: Secondary | ICD-10-CM | POA: Diagnosis not present

## 2018-12-02 DIAGNOSIS — R296 Repeated falls: Secondary | ICD-10-CM | POA: Diagnosis not present

## 2018-12-03 DIAGNOSIS — R41841 Cognitive communication deficit: Secondary | ICD-10-CM | POA: Diagnosis not present

## 2018-12-03 DIAGNOSIS — R296 Repeated falls: Secondary | ICD-10-CM | POA: Diagnosis not present

## 2018-12-07 DIAGNOSIS — R296 Repeated falls: Secondary | ICD-10-CM | POA: Diagnosis not present

## 2018-12-07 DIAGNOSIS — R41841 Cognitive communication deficit: Secondary | ICD-10-CM | POA: Diagnosis not present

## 2018-12-08 DIAGNOSIS — R41841 Cognitive communication deficit: Secondary | ICD-10-CM | POA: Diagnosis not present

## 2018-12-08 DIAGNOSIS — R296 Repeated falls: Secondary | ICD-10-CM | POA: Diagnosis not present

## 2018-12-09 DIAGNOSIS — R296 Repeated falls: Secondary | ICD-10-CM | POA: Diagnosis not present

## 2018-12-09 DIAGNOSIS — R41841 Cognitive communication deficit: Secondary | ICD-10-CM | POA: Diagnosis not present

## 2018-12-15 DIAGNOSIS — R296 Repeated falls: Secondary | ICD-10-CM | POA: Diagnosis not present

## 2018-12-15 DIAGNOSIS — R41841 Cognitive communication deficit: Secondary | ICD-10-CM | POA: Diagnosis not present

## 2018-12-16 DIAGNOSIS — R41841 Cognitive communication deficit: Secondary | ICD-10-CM | POA: Diagnosis not present

## 2018-12-16 DIAGNOSIS — R296 Repeated falls: Secondary | ICD-10-CM | POA: Diagnosis not present

## 2018-12-30 DIAGNOSIS — R296 Repeated falls: Secondary | ICD-10-CM | POA: Diagnosis not present

## 2018-12-30 DIAGNOSIS — R41841 Cognitive communication deficit: Secondary | ICD-10-CM | POA: Diagnosis not present

## 2018-12-30 DIAGNOSIS — M6281 Muscle weakness (generalized): Secondary | ICD-10-CM | POA: Diagnosis not present

## 2018-12-30 DIAGNOSIS — R2689 Other abnormalities of gait and mobility: Secondary | ICD-10-CM | POA: Diagnosis not present

## 2019-01-03 DIAGNOSIS — R2689 Other abnormalities of gait and mobility: Secondary | ICD-10-CM | POA: Diagnosis not present

## 2019-01-03 DIAGNOSIS — R41841 Cognitive communication deficit: Secondary | ICD-10-CM | POA: Diagnosis not present

## 2019-01-03 DIAGNOSIS — R296 Repeated falls: Secondary | ICD-10-CM | POA: Diagnosis not present

## 2019-01-03 DIAGNOSIS — M6281 Muscle weakness (generalized): Secondary | ICD-10-CM | POA: Diagnosis not present

## 2019-01-04 DIAGNOSIS — R41841 Cognitive communication deficit: Secondary | ICD-10-CM | POA: Diagnosis not present

## 2019-01-04 DIAGNOSIS — R296 Repeated falls: Secondary | ICD-10-CM | POA: Diagnosis not present

## 2019-01-04 DIAGNOSIS — M6281 Muscle weakness (generalized): Secondary | ICD-10-CM | POA: Diagnosis not present

## 2019-01-04 DIAGNOSIS — R2689 Other abnormalities of gait and mobility: Secondary | ICD-10-CM | POA: Diagnosis not present

## 2019-01-05 DIAGNOSIS — R2689 Other abnormalities of gait and mobility: Secondary | ICD-10-CM | POA: Diagnosis not present

## 2019-01-05 DIAGNOSIS — R41841 Cognitive communication deficit: Secondary | ICD-10-CM | POA: Diagnosis not present

## 2019-01-05 DIAGNOSIS — R296 Repeated falls: Secondary | ICD-10-CM | POA: Diagnosis not present

## 2019-01-05 DIAGNOSIS — M6281 Muscle weakness (generalized): Secondary | ICD-10-CM | POA: Diagnosis not present

## 2019-01-06 DIAGNOSIS — M6281 Muscle weakness (generalized): Secondary | ICD-10-CM | POA: Diagnosis not present

## 2019-01-06 DIAGNOSIS — R296 Repeated falls: Secondary | ICD-10-CM | POA: Diagnosis not present

## 2019-01-06 DIAGNOSIS — R41841 Cognitive communication deficit: Secondary | ICD-10-CM | POA: Diagnosis not present

## 2019-01-06 DIAGNOSIS — R2689 Other abnormalities of gait and mobility: Secondary | ICD-10-CM | POA: Diagnosis not present

## 2019-01-08 DIAGNOSIS — I2510 Atherosclerotic heart disease of native coronary artery without angina pectoris: Secondary | ICD-10-CM | POA: Diagnosis not present

## 2019-01-08 DIAGNOSIS — J449 Chronic obstructive pulmonary disease, unspecified: Secondary | ICD-10-CM | POA: Diagnosis not present

## 2019-01-08 DIAGNOSIS — F330 Major depressive disorder, recurrent, mild: Secondary | ICD-10-CM | POA: Diagnosis not present

## 2019-01-08 DIAGNOSIS — I10 Essential (primary) hypertension: Secondary | ICD-10-CM | POA: Diagnosis not present

## 2019-01-25 DIAGNOSIS — R41841 Cognitive communication deficit: Secondary | ICD-10-CM | POA: Diagnosis not present

## 2019-01-25 DIAGNOSIS — R296 Repeated falls: Secondary | ICD-10-CM | POA: Diagnosis not present

## 2019-01-25 DIAGNOSIS — R2689 Other abnormalities of gait and mobility: Secondary | ICD-10-CM | POA: Diagnosis not present

## 2019-01-25 DIAGNOSIS — M6281 Muscle weakness (generalized): Secondary | ICD-10-CM | POA: Diagnosis not present

## 2019-01-26 DIAGNOSIS — R296 Repeated falls: Secondary | ICD-10-CM | POA: Diagnosis not present

## 2019-01-26 DIAGNOSIS — R41841 Cognitive communication deficit: Secondary | ICD-10-CM | POA: Diagnosis not present

## 2019-01-26 DIAGNOSIS — M6281 Muscle weakness (generalized): Secondary | ICD-10-CM | POA: Diagnosis not present

## 2019-01-26 DIAGNOSIS — R2689 Other abnormalities of gait and mobility: Secondary | ICD-10-CM | POA: Diagnosis not present

## 2019-01-27 DIAGNOSIS — M6281 Muscle weakness (generalized): Secondary | ICD-10-CM | POA: Diagnosis not present

## 2019-01-27 DIAGNOSIS — R41841 Cognitive communication deficit: Secondary | ICD-10-CM | POA: Diagnosis not present

## 2019-01-27 DIAGNOSIS — R296 Repeated falls: Secondary | ICD-10-CM | POA: Diagnosis not present

## 2019-01-27 DIAGNOSIS — R2689 Other abnormalities of gait and mobility: Secondary | ICD-10-CM | POA: Diagnosis not present

## 2019-01-28 DIAGNOSIS — R296 Repeated falls: Secondary | ICD-10-CM | POA: Diagnosis not present

## 2019-01-28 DIAGNOSIS — R41841 Cognitive communication deficit: Secondary | ICD-10-CM | POA: Diagnosis not present

## 2019-01-28 DIAGNOSIS — M6281 Muscle weakness (generalized): Secondary | ICD-10-CM | POA: Diagnosis not present

## 2019-01-28 DIAGNOSIS — R2689 Other abnormalities of gait and mobility: Secondary | ICD-10-CM | POA: Diagnosis not present

## 2019-01-31 DIAGNOSIS — R2689 Other abnormalities of gait and mobility: Secondary | ICD-10-CM | POA: Diagnosis not present

## 2019-01-31 DIAGNOSIS — M6281 Muscle weakness (generalized): Secondary | ICD-10-CM | POA: Diagnosis not present

## 2019-01-31 DIAGNOSIS — R296 Repeated falls: Secondary | ICD-10-CM | POA: Diagnosis not present

## 2019-02-01 DIAGNOSIS — M6281 Muscle weakness (generalized): Secondary | ICD-10-CM | POA: Diagnosis not present

## 2019-02-01 DIAGNOSIS — R2689 Other abnormalities of gait and mobility: Secondary | ICD-10-CM | POA: Diagnosis not present

## 2019-02-01 DIAGNOSIS — R296 Repeated falls: Secondary | ICD-10-CM | POA: Diagnosis not present

## 2019-02-02 DIAGNOSIS — R2689 Other abnormalities of gait and mobility: Secondary | ICD-10-CM | POA: Diagnosis not present

## 2019-02-02 DIAGNOSIS — R296 Repeated falls: Secondary | ICD-10-CM | POA: Diagnosis not present

## 2019-02-02 DIAGNOSIS — M6281 Muscle weakness (generalized): Secondary | ICD-10-CM | POA: Diagnosis not present

## 2019-02-03 DIAGNOSIS — R2689 Other abnormalities of gait and mobility: Secondary | ICD-10-CM | POA: Diagnosis not present

## 2019-02-03 DIAGNOSIS — M6281 Muscle weakness (generalized): Secondary | ICD-10-CM | POA: Diagnosis not present

## 2019-02-03 DIAGNOSIS — R296 Repeated falls: Secondary | ICD-10-CM | POA: Diagnosis not present

## 2019-02-04 DIAGNOSIS — R079 Chest pain, unspecified: Secondary | ICD-10-CM | POA: Diagnosis not present

## 2019-02-04 DIAGNOSIS — R296 Repeated falls: Secondary | ICD-10-CM | POA: Diagnosis not present

## 2019-02-04 DIAGNOSIS — M6281 Muscle weakness (generalized): Secondary | ICD-10-CM | POA: Diagnosis not present

## 2019-02-04 DIAGNOSIS — M25551 Pain in right hip: Secondary | ICD-10-CM | POA: Diagnosis not present

## 2019-02-04 DIAGNOSIS — M25552 Pain in left hip: Secondary | ICD-10-CM | POA: Diagnosis not present

## 2019-02-04 DIAGNOSIS — R2689 Other abnormalities of gait and mobility: Secondary | ICD-10-CM | POA: Diagnosis not present

## 2019-02-05 DIAGNOSIS — R2689 Other abnormalities of gait and mobility: Secondary | ICD-10-CM | POA: Diagnosis not present

## 2019-02-05 DIAGNOSIS — M6281 Muscle weakness (generalized): Secondary | ICD-10-CM | POA: Diagnosis not present

## 2019-02-05 DIAGNOSIS — R296 Repeated falls: Secondary | ICD-10-CM | POA: Diagnosis not present

## 2019-02-08 DIAGNOSIS — R2689 Other abnormalities of gait and mobility: Secondary | ICD-10-CM | POA: Diagnosis not present

## 2019-02-08 DIAGNOSIS — R296 Repeated falls: Secondary | ICD-10-CM | POA: Diagnosis not present

## 2019-02-08 DIAGNOSIS — M6281 Muscle weakness (generalized): Secondary | ICD-10-CM | POA: Diagnosis not present

## 2019-02-09 DIAGNOSIS — R2689 Other abnormalities of gait and mobility: Secondary | ICD-10-CM | POA: Diagnosis not present

## 2019-02-09 DIAGNOSIS — R296 Repeated falls: Secondary | ICD-10-CM | POA: Diagnosis not present

## 2019-02-09 DIAGNOSIS — M6281 Muscle weakness (generalized): Secondary | ICD-10-CM | POA: Diagnosis not present

## 2019-02-10 DIAGNOSIS — M6281 Muscle weakness (generalized): Secondary | ICD-10-CM | POA: Diagnosis not present

## 2019-02-10 DIAGNOSIS — R296 Repeated falls: Secondary | ICD-10-CM | POA: Diagnosis not present

## 2019-02-10 DIAGNOSIS — R2689 Other abnormalities of gait and mobility: Secondary | ICD-10-CM | POA: Diagnosis not present

## 2019-02-11 DIAGNOSIS — R2689 Other abnormalities of gait and mobility: Secondary | ICD-10-CM | POA: Diagnosis not present

## 2019-02-11 DIAGNOSIS — R296 Repeated falls: Secondary | ICD-10-CM | POA: Diagnosis not present

## 2019-02-11 DIAGNOSIS — M6281 Muscle weakness (generalized): Secondary | ICD-10-CM | POA: Diagnosis not present

## 2019-02-14 DIAGNOSIS — M6281 Muscle weakness (generalized): Secondary | ICD-10-CM | POA: Diagnosis not present

## 2019-02-14 DIAGNOSIS — R296 Repeated falls: Secondary | ICD-10-CM | POA: Diagnosis not present

## 2019-02-14 DIAGNOSIS — R2689 Other abnormalities of gait and mobility: Secondary | ICD-10-CM | POA: Diagnosis not present

## 2019-02-15 DIAGNOSIS — R2689 Other abnormalities of gait and mobility: Secondary | ICD-10-CM | POA: Diagnosis not present

## 2019-02-15 DIAGNOSIS — M6281 Muscle weakness (generalized): Secondary | ICD-10-CM | POA: Diagnosis not present

## 2019-02-15 DIAGNOSIS — R296 Repeated falls: Secondary | ICD-10-CM | POA: Diagnosis not present

## 2019-02-16 DIAGNOSIS — R2689 Other abnormalities of gait and mobility: Secondary | ICD-10-CM | POA: Diagnosis not present

## 2019-02-16 DIAGNOSIS — R296 Repeated falls: Secondary | ICD-10-CM | POA: Diagnosis not present

## 2019-02-16 DIAGNOSIS — M6281 Muscle weakness (generalized): Secondary | ICD-10-CM | POA: Diagnosis not present

## 2019-02-17 DIAGNOSIS — R2689 Other abnormalities of gait and mobility: Secondary | ICD-10-CM | POA: Diagnosis not present

## 2019-02-17 DIAGNOSIS — R296 Repeated falls: Secondary | ICD-10-CM | POA: Diagnosis not present

## 2019-02-17 DIAGNOSIS — M6281 Muscle weakness (generalized): Secondary | ICD-10-CM | POA: Diagnosis not present

## 2019-02-18 DIAGNOSIS — R296 Repeated falls: Secondary | ICD-10-CM | POA: Diagnosis not present

## 2019-02-18 DIAGNOSIS — M6281 Muscle weakness (generalized): Secondary | ICD-10-CM | POA: Diagnosis not present

## 2019-02-18 DIAGNOSIS — R2689 Other abnormalities of gait and mobility: Secondary | ICD-10-CM | POA: Diagnosis not present

## 2019-02-21 DIAGNOSIS — R2689 Other abnormalities of gait and mobility: Secondary | ICD-10-CM | POA: Diagnosis not present

## 2019-02-21 DIAGNOSIS — M6281 Muscle weakness (generalized): Secondary | ICD-10-CM | POA: Diagnosis not present

## 2019-02-21 DIAGNOSIS — R296 Repeated falls: Secondary | ICD-10-CM | POA: Diagnosis not present

## 2019-02-22 DIAGNOSIS — R296 Repeated falls: Secondary | ICD-10-CM | POA: Diagnosis not present

## 2019-02-22 DIAGNOSIS — R2689 Other abnormalities of gait and mobility: Secondary | ICD-10-CM | POA: Diagnosis not present

## 2019-02-22 DIAGNOSIS — M6281 Muscle weakness (generalized): Secondary | ICD-10-CM | POA: Diagnosis not present

## 2019-02-23 DIAGNOSIS — M6281 Muscle weakness (generalized): Secondary | ICD-10-CM | POA: Diagnosis not present

## 2019-02-23 DIAGNOSIS — R2689 Other abnormalities of gait and mobility: Secondary | ICD-10-CM | POA: Diagnosis not present

## 2019-02-23 DIAGNOSIS — R296 Repeated falls: Secondary | ICD-10-CM | POA: Diagnosis not present

## 2019-02-24 DIAGNOSIS — R2689 Other abnormalities of gait and mobility: Secondary | ICD-10-CM | POA: Diagnosis not present

## 2019-02-24 DIAGNOSIS — R296 Repeated falls: Secondary | ICD-10-CM | POA: Diagnosis not present

## 2019-02-24 DIAGNOSIS — M6281 Muscle weakness (generalized): Secondary | ICD-10-CM | POA: Diagnosis not present

## 2019-02-25 DIAGNOSIS — M6281 Muscle weakness (generalized): Secondary | ICD-10-CM | POA: Diagnosis not present

## 2019-02-25 DIAGNOSIS — R296 Repeated falls: Secondary | ICD-10-CM | POA: Diagnosis not present

## 2019-02-25 DIAGNOSIS — R2689 Other abnormalities of gait and mobility: Secondary | ICD-10-CM | POA: Diagnosis not present

## 2019-02-28 DIAGNOSIS — M6281 Muscle weakness (generalized): Secondary | ICD-10-CM | POA: Diagnosis not present

## 2019-02-28 DIAGNOSIS — R296 Repeated falls: Secondary | ICD-10-CM | POA: Diagnosis not present

## 2019-02-28 DIAGNOSIS — R2689 Other abnormalities of gait and mobility: Secondary | ICD-10-CM | POA: Diagnosis not present

## 2019-03-01 DIAGNOSIS — M6281 Muscle weakness (generalized): Secondary | ICD-10-CM | POA: Diagnosis not present

## 2019-03-01 DIAGNOSIS — R2689 Other abnormalities of gait and mobility: Secondary | ICD-10-CM | POA: Diagnosis not present

## 2019-03-01 DIAGNOSIS — R296 Repeated falls: Secondary | ICD-10-CM | POA: Diagnosis not present

## 2019-03-02 DIAGNOSIS — R296 Repeated falls: Secondary | ICD-10-CM | POA: Diagnosis not present

## 2019-03-02 DIAGNOSIS — M6281 Muscle weakness (generalized): Secondary | ICD-10-CM | POA: Diagnosis not present

## 2019-03-02 DIAGNOSIS — R2689 Other abnormalities of gait and mobility: Secondary | ICD-10-CM | POA: Diagnosis not present

## 2019-03-03 DIAGNOSIS — M6281 Muscle weakness (generalized): Secondary | ICD-10-CM | POA: Diagnosis not present

## 2019-03-03 DIAGNOSIS — B351 Tinea unguium: Secondary | ICD-10-CM | POA: Diagnosis not present

## 2019-03-03 DIAGNOSIS — R262 Difficulty in walking, not elsewhere classified: Secondary | ICD-10-CM | POA: Diagnosis not present

## 2019-03-03 DIAGNOSIS — R296 Repeated falls: Secondary | ICD-10-CM | POA: Diagnosis not present

## 2019-03-03 DIAGNOSIS — I739 Peripheral vascular disease, unspecified: Secondary | ICD-10-CM | POA: Diagnosis not present

## 2019-03-03 DIAGNOSIS — R2689 Other abnormalities of gait and mobility: Secondary | ICD-10-CM | POA: Diagnosis not present

## 2019-03-03 DIAGNOSIS — L603 Nail dystrophy: Secondary | ICD-10-CM | POA: Diagnosis not present

## 2019-03-04 DIAGNOSIS — R2689 Other abnormalities of gait and mobility: Secondary | ICD-10-CM | POA: Diagnosis not present

## 2019-03-04 DIAGNOSIS — M6281 Muscle weakness (generalized): Secondary | ICD-10-CM | POA: Diagnosis not present

## 2019-03-04 DIAGNOSIS — R296 Repeated falls: Secondary | ICD-10-CM | POA: Diagnosis not present

## 2019-03-07 DIAGNOSIS — R2689 Other abnormalities of gait and mobility: Secondary | ICD-10-CM | POA: Diagnosis not present

## 2019-03-07 DIAGNOSIS — M6281 Muscle weakness (generalized): Secondary | ICD-10-CM | POA: Diagnosis not present

## 2019-03-07 DIAGNOSIS — R296 Repeated falls: Secondary | ICD-10-CM | POA: Diagnosis not present

## 2019-03-08 DIAGNOSIS — R296 Repeated falls: Secondary | ICD-10-CM | POA: Diagnosis not present

## 2019-03-08 DIAGNOSIS — M6281 Muscle weakness (generalized): Secondary | ICD-10-CM | POA: Diagnosis not present

## 2019-03-08 DIAGNOSIS — R2689 Other abnormalities of gait and mobility: Secondary | ICD-10-CM | POA: Diagnosis not present

## 2019-03-09 DIAGNOSIS — R296 Repeated falls: Secondary | ICD-10-CM | POA: Diagnosis not present

## 2019-03-09 DIAGNOSIS — M6281 Muscle weakness (generalized): Secondary | ICD-10-CM | POA: Diagnosis not present

## 2019-03-09 DIAGNOSIS — R2689 Other abnormalities of gait and mobility: Secondary | ICD-10-CM | POA: Diagnosis not present

## 2019-03-10 DIAGNOSIS — R2689 Other abnormalities of gait and mobility: Secondary | ICD-10-CM | POA: Diagnosis not present

## 2019-03-10 DIAGNOSIS — M6281 Muscle weakness (generalized): Secondary | ICD-10-CM | POA: Diagnosis not present

## 2019-03-10 DIAGNOSIS — R296 Repeated falls: Secondary | ICD-10-CM | POA: Diagnosis not present

## 2019-03-11 DIAGNOSIS — F330 Major depressive disorder, recurrent, mild: Secondary | ICD-10-CM | POA: Diagnosis not present

## 2019-03-11 DIAGNOSIS — J449 Chronic obstructive pulmonary disease, unspecified: Secondary | ICD-10-CM | POA: Diagnosis not present

## 2019-03-11 DIAGNOSIS — I10 Essential (primary) hypertension: Secondary | ICD-10-CM | POA: Diagnosis not present

## 2019-03-11 DIAGNOSIS — R296 Repeated falls: Secondary | ICD-10-CM | POA: Diagnosis not present

## 2019-03-11 DIAGNOSIS — M6281 Muscle weakness (generalized): Secondary | ICD-10-CM | POA: Diagnosis not present

## 2019-03-11 DIAGNOSIS — R2689 Other abnormalities of gait and mobility: Secondary | ICD-10-CM | POA: Diagnosis not present

## 2019-03-11 DIAGNOSIS — I2510 Atherosclerotic heart disease of native coronary artery without angina pectoris: Secondary | ICD-10-CM | POA: Diagnosis not present

## 2019-04-10 DIAGNOSIS — I2510 Atherosclerotic heart disease of native coronary artery without angina pectoris: Secondary | ICD-10-CM | POA: Diagnosis not present

## 2019-04-10 DIAGNOSIS — J449 Chronic obstructive pulmonary disease, unspecified: Secondary | ICD-10-CM | POA: Diagnosis not present

## 2019-04-10 DIAGNOSIS — I10 Essential (primary) hypertension: Secondary | ICD-10-CM | POA: Diagnosis not present

## 2019-04-10 DIAGNOSIS — F330 Major depressive disorder, recurrent, mild: Secondary | ICD-10-CM | POA: Diagnosis not present

## 2019-05-13 DIAGNOSIS — J449 Chronic obstructive pulmonary disease, unspecified: Secondary | ICD-10-CM | POA: Diagnosis not present

## 2019-05-13 DIAGNOSIS — I2510 Atherosclerotic heart disease of native coronary artery without angina pectoris: Secondary | ICD-10-CM | POA: Diagnosis not present

## 2019-05-13 DIAGNOSIS — I10 Essential (primary) hypertension: Secondary | ICD-10-CM | POA: Diagnosis not present

## 2019-05-13 DIAGNOSIS — F330 Major depressive disorder, recurrent, mild: Secondary | ICD-10-CM | POA: Diagnosis not present

## 2019-06-15 DIAGNOSIS — I2510 Atherosclerotic heart disease of native coronary artery without angina pectoris: Secondary | ICD-10-CM | POA: Diagnosis not present

## 2019-06-15 DIAGNOSIS — F330 Major depressive disorder, recurrent, mild: Secondary | ICD-10-CM | POA: Diagnosis not present

## 2019-06-15 DIAGNOSIS — J449 Chronic obstructive pulmonary disease, unspecified: Secondary | ICD-10-CM | POA: Diagnosis not present

## 2019-06-15 DIAGNOSIS — I10 Essential (primary) hypertension: Secondary | ICD-10-CM | POA: Diagnosis not present

## 2019-07-15 DIAGNOSIS — I2510 Atherosclerotic heart disease of native coronary artery without angina pectoris: Secondary | ICD-10-CM | POA: Diagnosis not present

## 2019-07-15 DIAGNOSIS — I10 Essential (primary) hypertension: Secondary | ICD-10-CM | POA: Diagnosis not present

## 2019-07-15 DIAGNOSIS — J449 Chronic obstructive pulmonary disease, unspecified: Secondary | ICD-10-CM | POA: Diagnosis not present

## 2019-07-15 DIAGNOSIS — F330 Major depressive disorder, recurrent, mild: Secondary | ICD-10-CM | POA: Diagnosis not present

## 2019-07-28 ENCOUNTER — Other Ambulatory Visit: Payer: Self-pay

## 2019-08-18 DIAGNOSIS — I2510 Atherosclerotic heart disease of native coronary artery without angina pectoris: Secondary | ICD-10-CM | POA: Diagnosis not present

## 2019-08-18 DIAGNOSIS — F330 Major depressive disorder, recurrent, mild: Secondary | ICD-10-CM | POA: Diagnosis not present

## 2019-08-18 DIAGNOSIS — I10 Essential (primary) hypertension: Secondary | ICD-10-CM | POA: Diagnosis not present

## 2019-08-18 DIAGNOSIS — J449 Chronic obstructive pulmonary disease, unspecified: Secondary | ICD-10-CM | POA: Diagnosis not present

## 2019-09-07 DIAGNOSIS — Z20828 Contact with and (suspected) exposure to other viral communicable diseases: Secondary | ICD-10-CM | POA: Diagnosis not present

## 2019-09-14 DIAGNOSIS — Z20828 Contact with and (suspected) exposure to other viral communicable diseases: Secondary | ICD-10-CM | POA: Diagnosis not present

## 2019-09-18 DIAGNOSIS — J449 Chronic obstructive pulmonary disease, unspecified: Secondary | ICD-10-CM | POA: Diagnosis not present

## 2019-09-18 DIAGNOSIS — I10 Essential (primary) hypertension: Secondary | ICD-10-CM | POA: Diagnosis not present

## 2019-09-18 DIAGNOSIS — I2510 Atherosclerotic heart disease of native coronary artery without angina pectoris: Secondary | ICD-10-CM | POA: Diagnosis not present

## 2019-09-18 DIAGNOSIS — F330 Major depressive disorder, recurrent, mild: Secondary | ICD-10-CM | POA: Diagnosis not present

## 2019-09-21 DIAGNOSIS — Z20828 Contact with and (suspected) exposure to other viral communicable diseases: Secondary | ICD-10-CM | POA: Diagnosis not present

## 2019-09-27 DIAGNOSIS — Z20828 Contact with and (suspected) exposure to other viral communicable diseases: Secondary | ICD-10-CM | POA: Diagnosis not present

## 2019-09-28 DIAGNOSIS — R4182 Altered mental status, unspecified: Secondary | ICD-10-CM | POA: Diagnosis not present

## 2019-09-28 DIAGNOSIS — R350 Frequency of micturition: Secondary | ICD-10-CM | POA: Diagnosis not present

## 2019-09-29 DIAGNOSIS — Z20828 Contact with and (suspected) exposure to other viral communicable diseases: Secondary | ICD-10-CM | POA: Diagnosis not present

## 2019-10-03 DIAGNOSIS — D3131 Benign neoplasm of right choroid: Secondary | ICD-10-CM | POA: Diagnosis not present

## 2019-10-03 DIAGNOSIS — H5213 Myopia, bilateral: Secondary | ICD-10-CM | POA: Diagnosis not present

## 2019-10-03 DIAGNOSIS — H524 Presbyopia: Secondary | ICD-10-CM | POA: Diagnosis not present

## 2019-10-03 DIAGNOSIS — H43813 Vitreous degeneration, bilateral: Secondary | ICD-10-CM | POA: Diagnosis not present

## 2019-10-03 DIAGNOSIS — H538 Other visual disturbances: Secondary | ICD-10-CM | POA: Diagnosis not present

## 2019-10-03 DIAGNOSIS — H52223 Regular astigmatism, bilateral: Secondary | ICD-10-CM | POA: Diagnosis not present

## 2019-10-03 DIAGNOSIS — H0102B Squamous blepharitis left eye, upper and lower eyelids: Secondary | ICD-10-CM | POA: Diagnosis not present

## 2019-10-03 DIAGNOSIS — H26493 Other secondary cataract, bilateral: Secondary | ICD-10-CM | POA: Diagnosis not present

## 2019-10-03 DIAGNOSIS — H0102A Squamous blepharitis right eye, upper and lower eyelids: Secondary | ICD-10-CM | POA: Diagnosis not present

## 2019-10-03 DIAGNOSIS — Z961 Presence of intraocular lens: Secondary | ICD-10-CM | POA: Diagnosis not present

## 2019-10-04 DIAGNOSIS — R296 Repeated falls: Secondary | ICD-10-CM | POA: Diagnosis not present

## 2019-10-04 DIAGNOSIS — M6281 Muscle weakness (generalized): Secondary | ICD-10-CM | POA: Diagnosis not present

## 2019-10-04 DIAGNOSIS — Z20828 Contact with and (suspected) exposure to other viral communicable diseases: Secondary | ICD-10-CM | POA: Diagnosis not present

## 2019-10-04 DIAGNOSIS — Z23 Encounter for immunization: Secondary | ICD-10-CM | POA: Diagnosis not present

## 2019-10-11 DIAGNOSIS — Z20828 Contact with and (suspected) exposure to other viral communicable diseases: Secondary | ICD-10-CM | POA: Diagnosis not present

## 2019-10-18 DIAGNOSIS — Z20828 Contact with and (suspected) exposure to other viral communicable diseases: Secondary | ICD-10-CM | POA: Diagnosis not present

## 2019-10-22 DIAGNOSIS — I10 Essential (primary) hypertension: Secondary | ICD-10-CM | POA: Diagnosis not present

## 2019-10-22 DIAGNOSIS — I2510 Atherosclerotic heart disease of native coronary artery without angina pectoris: Secondary | ICD-10-CM | POA: Diagnosis not present

## 2019-10-22 DIAGNOSIS — J449 Chronic obstructive pulmonary disease, unspecified: Secondary | ICD-10-CM | POA: Diagnosis not present

## 2019-10-22 DIAGNOSIS — F330 Major depressive disorder, recurrent, mild: Secondary | ICD-10-CM | POA: Diagnosis not present

## 2019-10-25 DIAGNOSIS — Z20828 Contact with and (suspected) exposure to other viral communicable diseases: Secondary | ICD-10-CM | POA: Diagnosis not present

## 2019-11-01 DIAGNOSIS — Z20828 Contact with and (suspected) exposure to other viral communicable diseases: Secondary | ICD-10-CM | POA: Diagnosis not present

## 2019-11-08 DIAGNOSIS — Z20828 Contact with and (suspected) exposure to other viral communicable diseases: Secondary | ICD-10-CM | POA: Diagnosis not present

## 2019-11-15 DIAGNOSIS — Z20828 Contact with and (suspected) exposure to other viral communicable diseases: Secondary | ICD-10-CM | POA: Diagnosis not present

## 2019-11-21 DIAGNOSIS — Z20828 Contact with and (suspected) exposure to other viral communicable diseases: Secondary | ICD-10-CM | POA: Diagnosis not present

## 2019-11-22 DIAGNOSIS — J449 Chronic obstructive pulmonary disease, unspecified: Secondary | ICD-10-CM | POA: Diagnosis not present

## 2019-11-22 DIAGNOSIS — F330 Major depressive disorder, recurrent, mild: Secondary | ICD-10-CM | POA: Diagnosis not present

## 2019-11-22 DIAGNOSIS — I2510 Atherosclerotic heart disease of native coronary artery without angina pectoris: Secondary | ICD-10-CM | POA: Diagnosis not present

## 2019-11-22 DIAGNOSIS — I10 Essential (primary) hypertension: Secondary | ICD-10-CM | POA: Diagnosis not present

## 2019-12-30 DIAGNOSIS — R41841 Cognitive communication deficit: Secondary | ICD-10-CM | POA: Diagnosis not present

## 2019-12-30 DIAGNOSIS — G309 Alzheimer's disease, unspecified: Secondary | ICD-10-CM | POA: Diagnosis not present

## 2019-12-30 DIAGNOSIS — R2689 Other abnormalities of gait and mobility: Secondary | ICD-10-CM | POA: Diagnosis not present

## 2019-12-30 DIAGNOSIS — M6281 Muscle weakness (generalized): Secondary | ICD-10-CM | POA: Diagnosis not present

## 2020-01-02 DIAGNOSIS — U071 COVID-19: Secondary | ICD-10-CM | POA: Diagnosis not present

## 2020-01-03 DIAGNOSIS — U071 COVID-19: Secondary | ICD-10-CM | POA: Diagnosis not present

## 2020-01-16 DIAGNOSIS — G309 Alzheimer's disease, unspecified: Secondary | ICD-10-CM | POA: Diagnosis not present

## 2020-01-16 DIAGNOSIS — R41841 Cognitive communication deficit: Secondary | ICD-10-CM | POA: Diagnosis not present

## 2020-01-16 DIAGNOSIS — R2689 Other abnormalities of gait and mobility: Secondary | ICD-10-CM | POA: Diagnosis not present

## 2020-01-16 DIAGNOSIS — M6281 Muscle weakness (generalized): Secondary | ICD-10-CM | POA: Diagnosis not present

## 2020-01-17 DIAGNOSIS — M6281 Muscle weakness (generalized): Secondary | ICD-10-CM | POA: Diagnosis not present

## 2020-01-17 DIAGNOSIS — G309 Alzheimer's disease, unspecified: Secondary | ICD-10-CM | POA: Diagnosis not present

## 2020-01-17 DIAGNOSIS — R41841 Cognitive communication deficit: Secondary | ICD-10-CM | POA: Diagnosis not present

## 2020-01-17 DIAGNOSIS — R2689 Other abnormalities of gait and mobility: Secondary | ICD-10-CM | POA: Diagnosis not present

## 2020-01-18 DIAGNOSIS — M6281 Muscle weakness (generalized): Secondary | ICD-10-CM | POA: Diagnosis not present

## 2020-01-18 DIAGNOSIS — R41841 Cognitive communication deficit: Secondary | ICD-10-CM | POA: Diagnosis not present

## 2020-01-18 DIAGNOSIS — R2689 Other abnormalities of gait and mobility: Secondary | ICD-10-CM | POA: Diagnosis not present

## 2020-01-18 DIAGNOSIS — G309 Alzheimer's disease, unspecified: Secondary | ICD-10-CM | POA: Diagnosis not present

## 2020-01-19 DIAGNOSIS — R41841 Cognitive communication deficit: Secondary | ICD-10-CM | POA: Diagnosis not present

## 2020-01-19 DIAGNOSIS — R2689 Other abnormalities of gait and mobility: Secondary | ICD-10-CM | POA: Diagnosis not present

## 2020-01-19 DIAGNOSIS — G309 Alzheimer's disease, unspecified: Secondary | ICD-10-CM | POA: Diagnosis not present

## 2020-01-19 DIAGNOSIS — M6281 Muscle weakness (generalized): Secondary | ICD-10-CM | POA: Diagnosis not present

## 2020-01-20 DIAGNOSIS — M6281 Muscle weakness (generalized): Secondary | ICD-10-CM | POA: Diagnosis not present

## 2020-01-20 DIAGNOSIS — R41841 Cognitive communication deficit: Secondary | ICD-10-CM | POA: Diagnosis not present

## 2020-01-20 DIAGNOSIS — G309 Alzheimer's disease, unspecified: Secondary | ICD-10-CM | POA: Diagnosis not present

## 2020-01-20 DIAGNOSIS — R2689 Other abnormalities of gait and mobility: Secondary | ICD-10-CM | POA: Diagnosis not present

## 2020-01-22 DIAGNOSIS — F330 Major depressive disorder, recurrent, mild: Secondary | ICD-10-CM | POA: Diagnosis not present

## 2020-01-22 DIAGNOSIS — I10 Essential (primary) hypertension: Secondary | ICD-10-CM | POA: Diagnosis not present

## 2020-01-22 DIAGNOSIS — I2510 Atherosclerotic heart disease of native coronary artery without angina pectoris: Secondary | ICD-10-CM | POA: Diagnosis not present

## 2020-01-22 DIAGNOSIS — J449 Chronic obstructive pulmonary disease, unspecified: Secondary | ICD-10-CM | POA: Diagnosis not present

## 2020-01-23 DIAGNOSIS — M6281 Muscle weakness (generalized): Secondary | ICD-10-CM | POA: Diagnosis not present

## 2020-01-23 DIAGNOSIS — R41841 Cognitive communication deficit: Secondary | ICD-10-CM | POA: Diagnosis not present

## 2020-01-23 DIAGNOSIS — R2689 Other abnormalities of gait and mobility: Secondary | ICD-10-CM | POA: Diagnosis not present

## 2020-01-23 DIAGNOSIS — G309 Alzheimer's disease, unspecified: Secondary | ICD-10-CM | POA: Diagnosis not present

## 2020-01-24 DIAGNOSIS — R2689 Other abnormalities of gait and mobility: Secondary | ICD-10-CM | POA: Diagnosis not present

## 2020-01-24 DIAGNOSIS — G309 Alzheimer's disease, unspecified: Secondary | ICD-10-CM | POA: Diagnosis not present

## 2020-01-24 DIAGNOSIS — M6281 Muscle weakness (generalized): Secondary | ICD-10-CM | POA: Diagnosis not present

## 2020-01-24 DIAGNOSIS — R41841 Cognitive communication deficit: Secondary | ICD-10-CM | POA: Diagnosis not present

## 2020-01-25 DIAGNOSIS — G309 Alzheimer's disease, unspecified: Secondary | ICD-10-CM | POA: Diagnosis not present

## 2020-01-25 DIAGNOSIS — M6281 Muscle weakness (generalized): Secondary | ICD-10-CM | POA: Diagnosis not present

## 2020-01-25 DIAGNOSIS — R2689 Other abnormalities of gait and mobility: Secondary | ICD-10-CM | POA: Diagnosis not present

## 2020-01-25 DIAGNOSIS — R41841 Cognitive communication deficit: Secondary | ICD-10-CM | POA: Diagnosis not present

## 2020-01-26 DIAGNOSIS — R41841 Cognitive communication deficit: Secondary | ICD-10-CM | POA: Diagnosis not present

## 2020-01-26 DIAGNOSIS — R2689 Other abnormalities of gait and mobility: Secondary | ICD-10-CM | POA: Diagnosis not present

## 2020-01-26 DIAGNOSIS — M6281 Muscle weakness (generalized): Secondary | ICD-10-CM | POA: Diagnosis not present

## 2020-01-26 DIAGNOSIS — G309 Alzheimer's disease, unspecified: Secondary | ICD-10-CM | POA: Diagnosis not present

## 2020-01-27 DIAGNOSIS — R41841 Cognitive communication deficit: Secondary | ICD-10-CM | POA: Diagnosis not present

## 2020-01-27 DIAGNOSIS — G309 Alzheimer's disease, unspecified: Secondary | ICD-10-CM | POA: Diagnosis not present

## 2020-01-27 DIAGNOSIS — R2689 Other abnormalities of gait and mobility: Secondary | ICD-10-CM | POA: Diagnosis not present

## 2020-01-27 DIAGNOSIS — M6281 Muscle weakness (generalized): Secondary | ICD-10-CM | POA: Diagnosis not present

## 2020-01-30 DIAGNOSIS — G309 Alzheimer's disease, unspecified: Secondary | ICD-10-CM | POA: Diagnosis not present

## 2020-01-30 DIAGNOSIS — R2689 Other abnormalities of gait and mobility: Secondary | ICD-10-CM | POA: Diagnosis not present

## 2020-01-30 DIAGNOSIS — R41841 Cognitive communication deficit: Secondary | ICD-10-CM | POA: Diagnosis not present

## 2020-01-30 DIAGNOSIS — M6281 Muscle weakness (generalized): Secondary | ICD-10-CM | POA: Diagnosis not present

## 2020-01-31 DIAGNOSIS — R2689 Other abnormalities of gait and mobility: Secondary | ICD-10-CM | POA: Diagnosis not present

## 2020-01-31 DIAGNOSIS — R41841 Cognitive communication deficit: Secondary | ICD-10-CM | POA: Diagnosis not present

## 2020-01-31 DIAGNOSIS — M6281 Muscle weakness (generalized): Secondary | ICD-10-CM | POA: Diagnosis not present

## 2020-01-31 DIAGNOSIS — G309 Alzheimer's disease, unspecified: Secondary | ICD-10-CM | POA: Diagnosis not present

## 2020-02-01 DIAGNOSIS — R2689 Other abnormalities of gait and mobility: Secondary | ICD-10-CM | POA: Diagnosis not present

## 2020-02-01 DIAGNOSIS — G309 Alzheimer's disease, unspecified: Secondary | ICD-10-CM | POA: Diagnosis not present

## 2020-02-01 DIAGNOSIS — M6281 Muscle weakness (generalized): Secondary | ICD-10-CM | POA: Diagnosis not present

## 2020-02-01 DIAGNOSIS — R41841 Cognitive communication deficit: Secondary | ICD-10-CM | POA: Diagnosis not present

## 2020-02-02 DIAGNOSIS — R2689 Other abnormalities of gait and mobility: Secondary | ICD-10-CM | POA: Diagnosis not present

## 2020-02-02 DIAGNOSIS — R41841 Cognitive communication deficit: Secondary | ICD-10-CM | POA: Diagnosis not present

## 2020-02-02 DIAGNOSIS — M6281 Muscle weakness (generalized): Secondary | ICD-10-CM | POA: Diagnosis not present

## 2020-02-02 DIAGNOSIS — G309 Alzheimer's disease, unspecified: Secondary | ICD-10-CM | POA: Diagnosis not present

## 2020-02-03 DIAGNOSIS — G309 Alzheimer's disease, unspecified: Secondary | ICD-10-CM | POA: Diagnosis not present

## 2020-02-03 DIAGNOSIS — M6281 Muscle weakness (generalized): Secondary | ICD-10-CM | POA: Diagnosis not present

## 2020-02-03 DIAGNOSIS — R2689 Other abnormalities of gait and mobility: Secondary | ICD-10-CM | POA: Diagnosis not present

## 2020-02-03 DIAGNOSIS — R41841 Cognitive communication deficit: Secondary | ICD-10-CM | POA: Diagnosis not present

## 2020-02-06 DIAGNOSIS — R2689 Other abnormalities of gait and mobility: Secondary | ICD-10-CM | POA: Diagnosis not present

## 2020-02-06 DIAGNOSIS — R41841 Cognitive communication deficit: Secondary | ICD-10-CM | POA: Diagnosis not present

## 2020-02-06 DIAGNOSIS — G309 Alzheimer's disease, unspecified: Secondary | ICD-10-CM | POA: Diagnosis not present

## 2020-02-06 DIAGNOSIS — M6281 Muscle weakness (generalized): Secondary | ICD-10-CM | POA: Diagnosis not present

## 2020-02-07 DIAGNOSIS — M6281 Muscle weakness (generalized): Secondary | ICD-10-CM | POA: Diagnosis not present

## 2020-02-07 DIAGNOSIS — G309 Alzheimer's disease, unspecified: Secondary | ICD-10-CM | POA: Diagnosis not present

## 2020-02-07 DIAGNOSIS — R41841 Cognitive communication deficit: Secondary | ICD-10-CM | POA: Diagnosis not present

## 2020-02-07 DIAGNOSIS — R2689 Other abnormalities of gait and mobility: Secondary | ICD-10-CM | POA: Diagnosis not present

## 2020-02-08 DIAGNOSIS — G309 Alzheimer's disease, unspecified: Secondary | ICD-10-CM | POA: Diagnosis not present

## 2020-02-08 DIAGNOSIS — M6281 Muscle weakness (generalized): Secondary | ICD-10-CM | POA: Diagnosis not present

## 2020-02-08 DIAGNOSIS — R2689 Other abnormalities of gait and mobility: Secondary | ICD-10-CM | POA: Diagnosis not present

## 2020-02-08 DIAGNOSIS — R41841 Cognitive communication deficit: Secondary | ICD-10-CM | POA: Diagnosis not present

## 2020-02-09 DIAGNOSIS — R2689 Other abnormalities of gait and mobility: Secondary | ICD-10-CM | POA: Diagnosis not present

## 2020-02-09 DIAGNOSIS — R41841 Cognitive communication deficit: Secondary | ICD-10-CM | POA: Diagnosis not present

## 2020-02-09 DIAGNOSIS — G309 Alzheimer's disease, unspecified: Secondary | ICD-10-CM | POA: Diagnosis not present

## 2020-02-09 DIAGNOSIS — M6281 Muscle weakness (generalized): Secondary | ICD-10-CM | POA: Diagnosis not present

## 2020-02-10 DIAGNOSIS — R41841 Cognitive communication deficit: Secondary | ICD-10-CM | POA: Diagnosis not present

## 2020-02-10 DIAGNOSIS — M6281 Muscle weakness (generalized): Secondary | ICD-10-CM | POA: Diagnosis not present

## 2020-02-10 DIAGNOSIS — G309 Alzheimer's disease, unspecified: Secondary | ICD-10-CM | POA: Diagnosis not present

## 2020-02-10 DIAGNOSIS — R2689 Other abnormalities of gait and mobility: Secondary | ICD-10-CM | POA: Diagnosis not present

## 2020-02-13 DIAGNOSIS — G309 Alzheimer's disease, unspecified: Secondary | ICD-10-CM | POA: Diagnosis not present

## 2020-02-13 DIAGNOSIS — M6281 Muscle weakness (generalized): Secondary | ICD-10-CM | POA: Diagnosis not present

## 2020-02-13 DIAGNOSIS — R2689 Other abnormalities of gait and mobility: Secondary | ICD-10-CM | POA: Diagnosis not present

## 2020-02-13 DIAGNOSIS — R41841 Cognitive communication deficit: Secondary | ICD-10-CM | POA: Diagnosis not present

## 2020-02-14 DIAGNOSIS — M6281 Muscle weakness (generalized): Secondary | ICD-10-CM | POA: Diagnosis not present

## 2020-02-14 DIAGNOSIS — R41841 Cognitive communication deficit: Secondary | ICD-10-CM | POA: Diagnosis not present

## 2020-02-14 DIAGNOSIS — R2689 Other abnormalities of gait and mobility: Secondary | ICD-10-CM | POA: Diagnosis not present

## 2020-02-14 DIAGNOSIS — G309 Alzheimer's disease, unspecified: Secondary | ICD-10-CM | POA: Diagnosis not present

## 2020-02-24 DIAGNOSIS — F330 Major depressive disorder, recurrent, mild: Secondary | ICD-10-CM | POA: Diagnosis not present

## 2020-02-24 DIAGNOSIS — I2510 Atherosclerotic heart disease of native coronary artery without angina pectoris: Secondary | ICD-10-CM | POA: Diagnosis not present

## 2020-02-24 DIAGNOSIS — J449 Chronic obstructive pulmonary disease, unspecified: Secondary | ICD-10-CM | POA: Diagnosis not present

## 2020-02-24 DIAGNOSIS — I10 Essential (primary) hypertension: Secondary | ICD-10-CM | POA: Diagnosis not present

## 2020-02-28 DIAGNOSIS — H524 Presbyopia: Secondary | ICD-10-CM | POA: Diagnosis not present

## 2020-02-28 DIAGNOSIS — H26493 Other secondary cataract, bilateral: Secondary | ICD-10-CM | POA: Diagnosis not present

## 2020-02-28 DIAGNOSIS — Z961 Presence of intraocular lens: Secondary | ICD-10-CM | POA: Diagnosis not present

## 2020-02-28 DIAGNOSIS — Z7951 Long term (current) use of inhaled steroids: Secondary | ICD-10-CM | POA: Diagnosis not present

## 2020-03-15 DIAGNOSIS — F329 Major depressive disorder, single episode, unspecified: Secondary | ICD-10-CM | POA: Diagnosis not present

## 2020-03-15 DIAGNOSIS — F0391 Unspecified dementia with behavioral disturbance: Secondary | ICD-10-CM | POA: Diagnosis not present

## 2020-03-25 DIAGNOSIS — I10 Essential (primary) hypertension: Secondary | ICD-10-CM | POA: Diagnosis not present

## 2020-03-25 DIAGNOSIS — I2510 Atherosclerotic heart disease of native coronary artery without angina pectoris: Secondary | ICD-10-CM | POA: Diagnosis not present

## 2020-03-25 DIAGNOSIS — F330 Major depressive disorder, recurrent, mild: Secondary | ICD-10-CM | POA: Diagnosis not present

## 2020-03-25 DIAGNOSIS — J449 Chronic obstructive pulmonary disease, unspecified: Secondary | ICD-10-CM | POA: Diagnosis not present

## 2020-04-03 DIAGNOSIS — Z20822 Contact with and (suspected) exposure to covid-19: Secondary | ICD-10-CM | POA: Diagnosis not present

## 2020-04-10 DIAGNOSIS — Z7951 Long term (current) use of inhaled steroids: Secondary | ICD-10-CM | POA: Diagnosis not present

## 2020-04-10 DIAGNOSIS — I739 Peripheral vascular disease, unspecified: Secondary | ICD-10-CM | POA: Diagnosis not present

## 2020-04-10 DIAGNOSIS — R262 Difficulty in walking, not elsewhere classified: Secondary | ICD-10-CM | POA: Diagnosis not present

## 2020-04-10 DIAGNOSIS — B351 Tinea unguium: Secondary | ICD-10-CM | POA: Diagnosis not present

## 2020-04-10 DIAGNOSIS — L603 Nail dystrophy: Secondary | ICD-10-CM | POA: Diagnosis not present

## 2020-04-12 DIAGNOSIS — F0391 Unspecified dementia with behavioral disturbance: Secondary | ICD-10-CM | POA: Diagnosis not present

## 2020-04-12 DIAGNOSIS — F329 Major depressive disorder, single episode, unspecified: Secondary | ICD-10-CM | POA: Diagnosis not present

## 2020-04-12 DIAGNOSIS — R638 Other symptoms and signs concerning food and fluid intake: Secondary | ICD-10-CM | POA: Diagnosis not present

## 2020-04-12 DIAGNOSIS — F419 Anxiety disorder, unspecified: Secondary | ICD-10-CM | POA: Diagnosis not present

## 2020-04-26 DIAGNOSIS — F0391 Unspecified dementia with behavioral disturbance: Secondary | ICD-10-CM | POA: Diagnosis not present

## 2020-04-26 DIAGNOSIS — J449 Chronic obstructive pulmonary disease, unspecified: Secondary | ICD-10-CM | POA: Diagnosis not present

## 2020-04-26 DIAGNOSIS — F329 Major depressive disorder, single episode, unspecified: Secondary | ICD-10-CM | POA: Diagnosis not present

## 2020-04-26 DIAGNOSIS — R638 Other symptoms and signs concerning food and fluid intake: Secondary | ICD-10-CM | POA: Diagnosis not present

## 2020-04-26 DIAGNOSIS — F0151 Vascular dementia with behavioral disturbance: Secondary | ICD-10-CM | POA: Diagnosis not present

## 2020-04-26 DIAGNOSIS — I10 Essential (primary) hypertension: Secondary | ICD-10-CM | POA: Diagnosis not present

## 2020-04-26 DIAGNOSIS — F419 Anxiety disorder, unspecified: Secondary | ICD-10-CM | POA: Diagnosis not present

## 2020-04-26 DIAGNOSIS — I2510 Atherosclerotic heart disease of native coronary artery without angina pectoris: Secondary | ICD-10-CM | POA: Diagnosis not present

## 2020-04-26 DIAGNOSIS — F330 Major depressive disorder, recurrent, mild: Secondary | ICD-10-CM | POA: Diagnosis not present

## 2020-05-01 DIAGNOSIS — Z5181 Encounter for therapeutic drug level monitoring: Secondary | ICD-10-CM | POA: Diagnosis not present

## 2020-05-01 DIAGNOSIS — Z79899 Other long term (current) drug therapy: Secondary | ICD-10-CM | POA: Diagnosis not present

## 2020-05-02 DIAGNOSIS — R41841 Cognitive communication deficit: Secondary | ICD-10-CM | POA: Diagnosis not present

## 2020-05-02 DIAGNOSIS — G309 Alzheimer's disease, unspecified: Secondary | ICD-10-CM | POA: Diagnosis not present

## 2020-05-07 DIAGNOSIS — J189 Pneumonia, unspecified organism: Secondary | ICD-10-CM | POA: Diagnosis present

## 2020-05-07 DIAGNOSIS — F0150 Vascular dementia without behavioral disturbance: Secondary | ICD-10-CM | POA: Diagnosis present

## 2020-05-07 DIAGNOSIS — M6281 Muscle weakness (generalized): Secondary | ICD-10-CM | POA: Diagnosis not present

## 2020-05-07 DIAGNOSIS — F339 Major depressive disorder, recurrent, unspecified: Secondary | ICD-10-CM | POA: Diagnosis not present

## 2020-05-07 DIAGNOSIS — G309 Alzheimer's disease, unspecified: Secondary | ICD-10-CM | POA: Diagnosis not present

## 2020-05-07 DIAGNOSIS — R2689 Other abnormalities of gait and mobility: Secondary | ICD-10-CM | POA: Diagnosis not present

## 2020-05-07 DIAGNOSIS — Z87891 Personal history of nicotine dependence: Secondary | ICD-10-CM | POA: Diagnosis not present

## 2020-05-07 DIAGNOSIS — J440 Chronic obstructive pulmonary disease with acute lower respiratory infection: Secondary | ICD-10-CM | POA: Diagnosis present

## 2020-05-07 DIAGNOSIS — R404 Transient alteration of awareness: Secondary | ICD-10-CM | POA: Diagnosis not present

## 2020-05-07 DIAGNOSIS — Z8673 Personal history of transient ischemic attack (TIA), and cerebral infarction without residual deficits: Secondary | ICD-10-CM | POA: Diagnosis not present

## 2020-05-07 DIAGNOSIS — F329 Major depressive disorder, single episode, unspecified: Secondary | ICD-10-CM | POA: Diagnosis not present

## 2020-05-07 DIAGNOSIS — E785 Hyperlipidemia, unspecified: Secondary | ICD-10-CM | POA: Diagnosis present

## 2020-05-07 DIAGNOSIS — Z87440 Personal history of urinary (tract) infections: Secondary | ICD-10-CM | POA: Diagnosis not present

## 2020-05-07 DIAGNOSIS — Z955 Presence of coronary angioplasty implant and graft: Secondary | ICD-10-CM | POA: Diagnosis not present

## 2020-05-07 DIAGNOSIS — Z7951 Long term (current) use of inhaled steroids: Secondary | ICD-10-CM | POA: Diagnosis not present

## 2020-05-07 DIAGNOSIS — G9341 Metabolic encephalopathy: Secondary | ICD-10-CM | POA: Diagnosis present

## 2020-05-07 DIAGNOSIS — R296 Repeated falls: Secondary | ICD-10-CM | POA: Diagnosis not present

## 2020-05-07 DIAGNOSIS — Z20822 Contact with and (suspected) exposure to covid-19: Secondary | ICD-10-CM | POA: Diagnosis present

## 2020-05-07 DIAGNOSIS — E869 Volume depletion, unspecified: Secondary | ICD-10-CM | POA: Diagnosis present

## 2020-05-07 DIAGNOSIS — I2510 Atherosclerotic heart disease of native coronary artery without angina pectoris: Secondary | ICD-10-CM | POA: Diagnosis present

## 2020-05-07 DIAGNOSIS — J449 Chronic obstructive pulmonary disease, unspecified: Secondary | ICD-10-CM | POA: Diagnosis not present

## 2020-05-07 DIAGNOSIS — R918 Other nonspecific abnormal finding of lung field: Secondary | ICD-10-CM | POA: Diagnosis not present

## 2020-05-07 DIAGNOSIS — R410 Disorientation, unspecified: Secondary | ICD-10-CM | POA: Diagnosis not present

## 2020-05-07 DIAGNOSIS — Z7982 Long term (current) use of aspirin: Secondary | ICD-10-CM | POA: Diagnosis not present

## 2020-05-07 DIAGNOSIS — I10 Essential (primary) hypertension: Secondary | ICD-10-CM | POA: Diagnosis present

## 2020-05-10 DIAGNOSIS — R2689 Other abnormalities of gait and mobility: Secondary | ICD-10-CM | POA: Diagnosis not present

## 2020-05-10 DIAGNOSIS — I10 Essential (primary) hypertension: Secondary | ICD-10-CM | POA: Diagnosis not present

## 2020-05-10 DIAGNOSIS — J449 Chronic obstructive pulmonary disease, unspecified: Secondary | ICD-10-CM | POA: Diagnosis not present

## 2020-05-10 DIAGNOSIS — J189 Pneumonia, unspecified organism: Secondary | ICD-10-CM | POA: Diagnosis not present

## 2020-05-10 DIAGNOSIS — F339 Major depressive disorder, recurrent, unspecified: Secondary | ICD-10-CM | POA: Diagnosis not present

## 2020-05-10 DIAGNOSIS — R296 Repeated falls: Secondary | ICD-10-CM | POA: Diagnosis not present

## 2020-05-10 DIAGNOSIS — J440 Chronic obstructive pulmonary disease with acute lower respiratory infection: Secondary | ICD-10-CM | POA: Diagnosis not present

## 2020-05-10 DIAGNOSIS — G309 Alzheimer's disease, unspecified: Secondary | ICD-10-CM | POA: Diagnosis not present

## 2020-05-10 DIAGNOSIS — F0150 Vascular dementia without behavioral disturbance: Secondary | ICD-10-CM | POA: Diagnosis not present

## 2020-05-10 DIAGNOSIS — M6281 Muscle weakness (generalized): Secondary | ICD-10-CM | POA: Diagnosis not present

## 2020-05-10 DIAGNOSIS — G9341 Metabolic encephalopathy: Secondary | ICD-10-CM | POA: Diagnosis not present

## 2020-05-10 DIAGNOSIS — E869 Volume depletion, unspecified: Secondary | ICD-10-CM | POA: Diagnosis not present

## 2020-05-10 DIAGNOSIS — I2510 Atherosclerotic heart disease of native coronary artery without angina pectoris: Secondary | ICD-10-CM | POA: Diagnosis not present

## 2020-06-03 DIAGNOSIS — I10 Essential (primary) hypertension: Secondary | ICD-10-CM | POA: Diagnosis not present

## 2020-06-03 DIAGNOSIS — F0150 Vascular dementia without behavioral disturbance: Secondary | ICD-10-CM | POA: Diagnosis not present

## 2020-06-03 DIAGNOSIS — J449 Chronic obstructive pulmonary disease, unspecified: Secondary | ICD-10-CM | POA: Diagnosis not present

## 2020-07-05 DIAGNOSIS — F0150 Vascular dementia without behavioral disturbance: Secondary | ICD-10-CM | POA: Diagnosis not present

## 2020-07-05 DIAGNOSIS — F419 Anxiety disorder, unspecified: Secondary | ICD-10-CM | POA: Diagnosis not present

## 2020-07-05 DIAGNOSIS — F329 Major depressive disorder, single episode, unspecified: Secondary | ICD-10-CM | POA: Diagnosis not present

## 2020-07-05 DIAGNOSIS — I10 Essential (primary) hypertension: Secondary | ICD-10-CM | POA: Diagnosis not present

## 2020-07-05 DIAGNOSIS — F0390 Unspecified dementia without behavioral disturbance: Secondary | ICD-10-CM | POA: Diagnosis not present

## 2020-07-05 DIAGNOSIS — R638 Other symptoms and signs concerning food and fluid intake: Secondary | ICD-10-CM | POA: Diagnosis not present

## 2020-07-05 DIAGNOSIS — J449 Chronic obstructive pulmonary disease, unspecified: Secondary | ICD-10-CM | POA: Diagnosis not present

## 2020-07-20 DIAGNOSIS — Z23 Encounter for immunization: Secondary | ICD-10-CM | POA: Diagnosis not present

## 2020-08-05 DIAGNOSIS — F0150 Vascular dementia without behavioral disturbance: Secondary | ICD-10-CM | POA: Diagnosis not present

## 2020-08-05 DIAGNOSIS — J449 Chronic obstructive pulmonary disease, unspecified: Secondary | ICD-10-CM | POA: Diagnosis not present

## 2020-08-05 DIAGNOSIS — I10 Essential (primary) hypertension: Secondary | ICD-10-CM | POA: Diagnosis not present

## 2020-08-16 DIAGNOSIS — F0390 Unspecified dementia without behavioral disturbance: Secondary | ICD-10-CM | POA: Diagnosis not present

## 2020-09-06 DIAGNOSIS — I10 Essential (primary) hypertension: Secondary | ICD-10-CM | POA: Diagnosis not present

## 2020-09-06 DIAGNOSIS — F0150 Vascular dementia without behavioral disturbance: Secondary | ICD-10-CM | POA: Diagnosis not present

## 2020-09-06 DIAGNOSIS — J449 Chronic obstructive pulmonary disease, unspecified: Secondary | ICD-10-CM | POA: Diagnosis not present

## 2020-09-13 DIAGNOSIS — R638 Other symptoms and signs concerning food and fluid intake: Secondary | ICD-10-CM | POA: Diagnosis not present

## 2020-09-13 DIAGNOSIS — F0390 Unspecified dementia without behavioral disturbance: Secondary | ICD-10-CM | POA: Diagnosis not present

## 2020-09-20 DIAGNOSIS — Z23 Encounter for immunization: Secondary | ICD-10-CM | POA: Diagnosis not present

## 2020-09-25 DIAGNOSIS — B351 Tinea unguium: Secondary | ICD-10-CM | POA: Diagnosis not present

## 2020-09-25 DIAGNOSIS — I739 Peripheral vascular disease, unspecified: Secondary | ICD-10-CM | POA: Diagnosis not present

## 2020-09-25 DIAGNOSIS — R262 Difficulty in walking, not elsewhere classified: Secondary | ICD-10-CM | POA: Diagnosis not present

## 2020-09-25 DIAGNOSIS — L603 Nail dystrophy: Secondary | ICD-10-CM | POA: Diagnosis not present

## 2020-09-25 DIAGNOSIS — Z7951 Long term (current) use of inhaled steroids: Secondary | ICD-10-CM | POA: Diagnosis not present

## 2020-10-07 DIAGNOSIS — I10 Essential (primary) hypertension: Secondary | ICD-10-CM | POA: Diagnosis not present

## 2020-10-07 DIAGNOSIS — F0150 Vascular dementia without behavioral disturbance: Secondary | ICD-10-CM | POA: Diagnosis not present

## 2020-10-07 DIAGNOSIS — J449 Chronic obstructive pulmonary disease, unspecified: Secondary | ICD-10-CM | POA: Diagnosis not present

## 2020-10-11 DIAGNOSIS — R441 Visual hallucinations: Secondary | ICD-10-CM | POA: Diagnosis not present

## 2020-10-11 DIAGNOSIS — F413 Other mixed anxiety disorders: Secondary | ICD-10-CM | POA: Diagnosis not present

## 2020-10-11 DIAGNOSIS — F0390 Unspecified dementia without behavioral disturbance: Secondary | ICD-10-CM | POA: Diagnosis not present

## 2020-10-16 DIAGNOSIS — H0102A Squamous blepharitis right eye, upper and lower eyelids: Secondary | ICD-10-CM | POA: Diagnosis not present

## 2020-10-16 DIAGNOSIS — H52223 Regular astigmatism, bilateral: Secondary | ICD-10-CM | POA: Diagnosis not present

## 2020-10-16 DIAGNOSIS — H524 Presbyopia: Secondary | ICD-10-CM | POA: Diagnosis not present

## 2020-10-16 DIAGNOSIS — D3131 Benign neoplasm of right choroid: Secondary | ICD-10-CM | POA: Diagnosis not present

## 2020-10-16 DIAGNOSIS — H0102B Squamous blepharitis left eye, upper and lower eyelids: Secondary | ICD-10-CM | POA: Diagnosis not present

## 2020-10-16 DIAGNOSIS — H538 Other visual disturbances: Secondary | ICD-10-CM | POA: Diagnosis not present

## 2020-10-16 DIAGNOSIS — H26493 Other secondary cataract, bilateral: Secondary | ICD-10-CM | POA: Diagnosis not present

## 2020-10-16 DIAGNOSIS — H43813 Vitreous degeneration, bilateral: Secondary | ICD-10-CM | POA: Diagnosis not present

## 2020-10-16 DIAGNOSIS — H5213 Myopia, bilateral: Secondary | ICD-10-CM | POA: Diagnosis not present

## 2020-10-16 DIAGNOSIS — Z961 Presence of intraocular lens: Secondary | ICD-10-CM | POA: Diagnosis not present

## 2020-11-07 DIAGNOSIS — I10 Essential (primary) hypertension: Secondary | ICD-10-CM | POA: Diagnosis not present

## 2020-11-07 DIAGNOSIS — J449 Chronic obstructive pulmonary disease, unspecified: Secondary | ICD-10-CM | POA: Diagnosis not present

## 2020-11-07 DIAGNOSIS — F0150 Vascular dementia without behavioral disturbance: Secondary | ICD-10-CM | POA: Diagnosis not present

## 2020-11-12 DIAGNOSIS — F413 Other mixed anxiety disorders: Secondary | ICD-10-CM | POA: Diagnosis not present

## 2020-11-12 DIAGNOSIS — R638 Other symptoms and signs concerning food and fluid intake: Secondary | ICD-10-CM | POA: Diagnosis not present

## 2020-11-12 DIAGNOSIS — F0390 Unspecified dementia without behavioral disturbance: Secondary | ICD-10-CM | POA: Diagnosis not present

## 2020-12-10 DIAGNOSIS — F0390 Unspecified dementia without behavioral disturbance: Secondary | ICD-10-CM | POA: Diagnosis not present

## 2020-12-10 DIAGNOSIS — F413 Other mixed anxiety disorders: Secondary | ICD-10-CM | POA: Diagnosis not present

## 2020-12-10 DIAGNOSIS — R638 Other symptoms and signs concerning food and fluid intake: Secondary | ICD-10-CM | POA: Diagnosis not present

## 2020-12-11 DIAGNOSIS — I10 Essential (primary) hypertension: Secondary | ICD-10-CM | POA: Diagnosis not present

## 2020-12-11 DIAGNOSIS — J449 Chronic obstructive pulmonary disease, unspecified: Secondary | ICD-10-CM | POA: Diagnosis not present

## 2020-12-11 DIAGNOSIS — F0150 Vascular dementia without behavioral disturbance: Secondary | ICD-10-CM | POA: Diagnosis not present

## 2021-01-14 ENCOUNTER — Emergency Department (HOSPITAL_COMMUNITY)
Admission: EM | Admit: 2021-01-14 | Discharge: 2021-01-14 | Disposition: A | Discharge status: Home / Self Care | Payer: Medicare Other | Attending: Emergency Medicine | Admitting: Emergency Medicine

## 2021-01-14 ENCOUNTER — Other Ambulatory Visit: Payer: Self-pay

## 2021-01-14 ENCOUNTER — Encounter (HOSPITAL_COMMUNITY): Payer: Self-pay | Admitting: Emergency Medicine

## 2021-01-14 ENCOUNTER — Emergency Department (HOSPITAL_COMMUNITY): Payer: Medicare Other

## 2021-01-14 DIAGNOSIS — Z79899 Other long term (current) drug therapy: Secondary | ICD-10-CM | POA: Diagnosis not present

## 2021-01-14 DIAGNOSIS — I10 Essential (primary) hypertension: Secondary | ICD-10-CM | POA: Insufficient documentation

## 2021-01-14 DIAGNOSIS — S0990XA Unspecified injury of head, initial encounter: Secondary | ICD-10-CM | POA: Insufficient documentation

## 2021-01-14 DIAGNOSIS — W19XXXA Unspecified fall, initial encounter: Secondary | ICD-10-CM | POA: Insufficient documentation

## 2021-01-14 DIAGNOSIS — F0150 Vascular dementia without behavioral disturbance: Secondary | ICD-10-CM | POA: Diagnosis not present

## 2021-01-14 DIAGNOSIS — Z87891 Personal history of nicotine dependence: Secondary | ICD-10-CM | POA: Diagnosis not present

## 2021-01-14 NOTE — ED Triage Notes (Signed)
[facility] - pt from [facility]. Pt fell tonight and possibly hit his head.

## 2021-01-14 NOTE — ED Provider Notes (Signed)
[facility] EMERGENCY DEPARTMENT Provider Note   CSN: [id number] Arrival date & time: 01/14/21  4098     History Chief Complaint  Patient presents with  . Fall    David Richmond is a 80 y.o. male.  Patient is a 80 year old male with history of vascular dementia, expressive aphasia.  Patient sent from his extended care facility for evaluation of a fall.  Patient apparently was found on the floor after an unwitnessed fall.  Patient adds no additional history secondary to dementia.  Nursing home staff made the mention of a possible "dent" in the back of his head.  The history is provided by the patient.  Fall This is a new problem. The current episode started 1 to 2 hours ago. The problem occurs constantly. The problem has not changed since onset.Nothing aggravates the symptoms. Nothing relieves the symptoms.       Past Medical History:  Diagnosis Date  . Hypertension     Patient Active Problem List   Diagnosis Date Noted  . Dementia ([place]) 12/26/2016  . Acute encephalopathy 12/26/2016  . Sinus bradycardia 12/26/2016  . Orthostasis 12/26/2016  . Expressive aphasia 12/26/2016  . Dysarthria 12/26/2016  . Seborrhea 12/26/2016  . Bradycardia     History reviewed. No pertinent surgical history.     Family History  Problem Relation Age of Onset  . Heart disease Other     Social History   Tobacco Use  . Smoking status: Former Research scientist (life sciences)  . Smokeless tobacco: Never Used  Substance Use Topics  . Alcohol use: No  . Drug use: No    Home Medications Prior to Admission medications   Medication Sig Start Date End Date Taking? Authorizing Provider  albuterol (PROVENTIL HFA;VENTOLIN HFA) 108 (90 Base) MCG/ACT inhaler Inhale 1-2 puffs into the lungs every 6 (six) hours as needed for wheezing or shortness of breath.    [provider]  atorvastatin (LIPITOR) 10 MG tablet Take 10 mg by mouth at bedtime.     [provider]  budesonide-formoterol (SYMBICORT)  160-4.5 MCG/ACT inhaler Inhale 2 puffs into the lungs 2 (two) times daily.    [provider]  fluticasone (FLONASE) 50 MCG/ACT nasal spray Place 1 spray into both nostrils daily.    [provider]  hydrALAZINE (APRESOLINE) 25 MG tablet Take 1 tablet (25 mg total) by mouth every 8 (eight) hours. 12/30/16   Thurnell Lose, MD  isosorbide mononitrate (IMDUR) 30 MG 24 hr tablet Take 30 mg by mouth daily.    [provider]  meclizine (ANTIVERT) 25 MG tablet Take 25 mg by mouth 3 (three) times daily as needed for dizziness.    [provider]  Memantine HCl-Donepezil HCl (NAMZARIC) 28-10 MG CP24 Take 1 capsule by mouth daily.    [provider]    Allergies    Patient has no known allergies.  Review of Systems   Review of Systems  Unable to perform ROS: Dementia    Physical Exam Updated Vital Signs Ht 5\' 4"  (1.626 m)   Wt 79.4 kg   BMI 30.05 kg/m   Physical Exam Vitals and nursing note reviewed.  Constitutional:      General: He is not in acute distress.    Appearance: He is well-developed and well-nourished. He is not diaphoretic.     Comments: Patient is a 80 year old male in no acute distress.  He is resting comfortably, but will open his eyes when stimulated.  Patient will not speak or add  additional history.  HENT:     Head: Normocephalic and atraumatic.     Mouth/Throat:     Mouth: Oropharynx is clear and moist.  Cardiovascular:     Rate and Rhythm: Normal rate and regular rhythm.     Heart sounds: No murmur heard. No friction rub.  Pulmonary:     Effort: Pulmonary effort is normal. No respiratory distress.     Breath sounds: Normal breath sounds. No wheezing or rales.  Abdominal:     General: Bowel sounds are normal. There is no distension.     Palpations: Abdomen is soft.     Tenderness: There is no abdominal tenderness.  Musculoskeletal:        General: No edema. Normal range of motion.     Cervical back: Normal range  of motion and neck supple.     Comments: Pelvis is stable.  Hips with painless range of motion bilaterally.  Skin:    General: Skin is warm and dry.  Neurological:     Mental Status: He is oriented to person, place, and time.     Coordination: Coordination normal.     ED Results / Procedures / Treatments   Labs (all labs ordered are listed, but only abnormal results are displayed) Labs Reviewed - No data to display  EKG None  Radiology No results found.  Procedures Procedures (including critical care time)  Medications Ordered in ED Medications - No data to display  ED Course  I have reviewed the triage vital signs and the nursing notes.  Pertinent labs & imaging results that were available during my care of the patient were reviewed by me and considered in my medical decision making (see chart for details).    MDM Rules/Calculators/A&P  Patient brought to the ER from his extended care facility after being found on the floor.  Patient with history of dementia and adds little additional history.  CT scans of the head and cervical spine will be obtained.  Care signed out to oncoming provider who will obtain the results of the CT scans and determine the final disposition.  Final Clinical Impression(s) / ED Diagnoses Final diagnoses:  None    Rx / DC Orders ED Discharge Orders    None       Veryl Speak, MD 01/15/21 2312

## 2021-01-14 NOTE — Discharge Instructions (Addendum)
Take Tylenol for pain.  Follow-up with your family doctor later this week if any problem

## 2021-01-14 NOTE — ED Notes (Addendum)
Pt placed on bedpan

## 2021-10-01 IMAGING — CT CT HEAD W/O CM
3 series · 15 of 47 positions shown, 18 images · non-contrast
Comparison: Brain MRI 12/27/2016.  Head CT 05/07/2020

CLINICAL DATA: 79-year-old male status post fall at [HOSPITAL]
this morning. Struck back of head.

EXAM:
CT HEAD WITHOUT CONTRAST
TECHNIQUE: Contiguous axial images were obtained from the base of the skull
through the vertex without intravenous contrast.

[Series 3: head w o · axial · 0.43mm/px · z∈[+258,+393]mm · 9 of 33 slices shown, 12 images]
[im 3/33  brain]
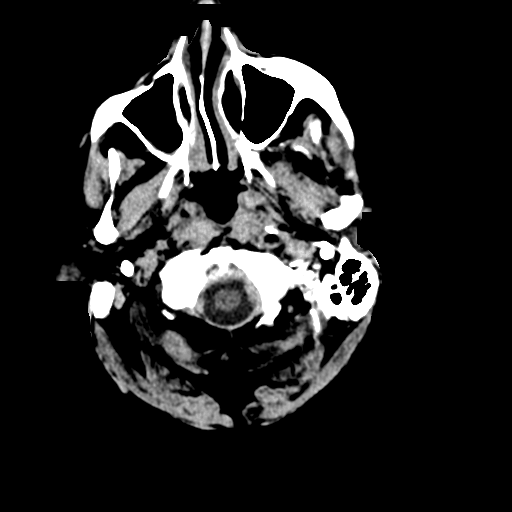
[im 3/33  bone]
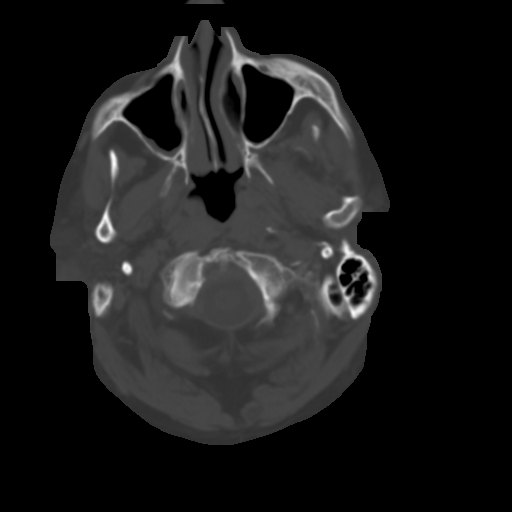
[im 6/33  brain]
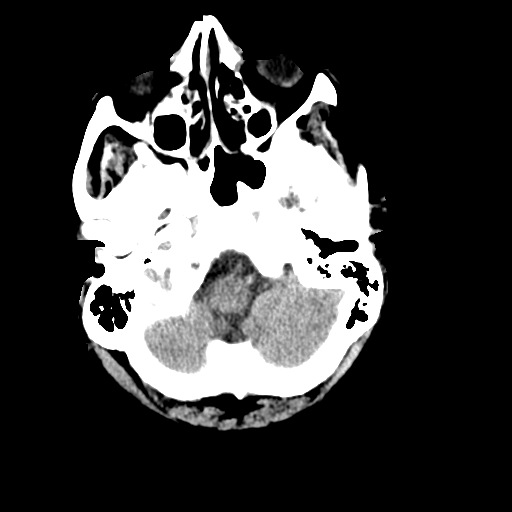
[im 9/33  brain]
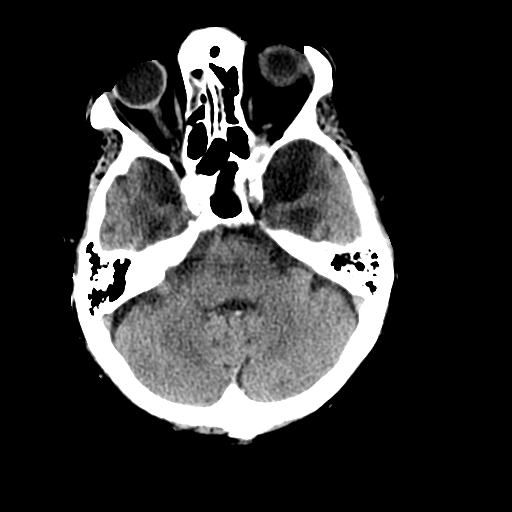
[im 13/33  brain]
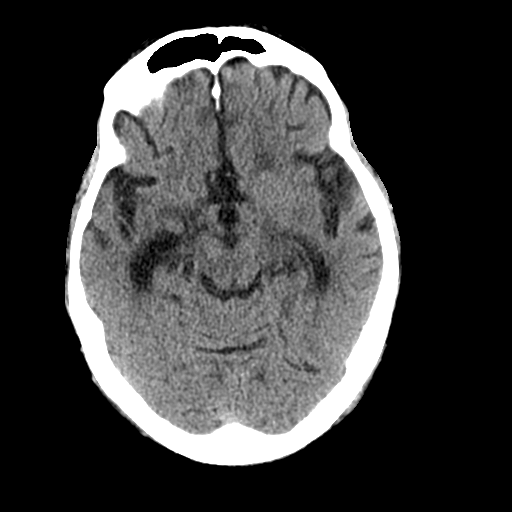
[im 17/33  brain]
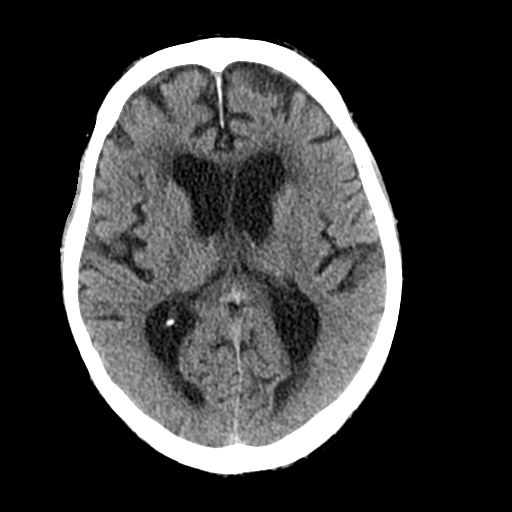
[im 17/33  bone]
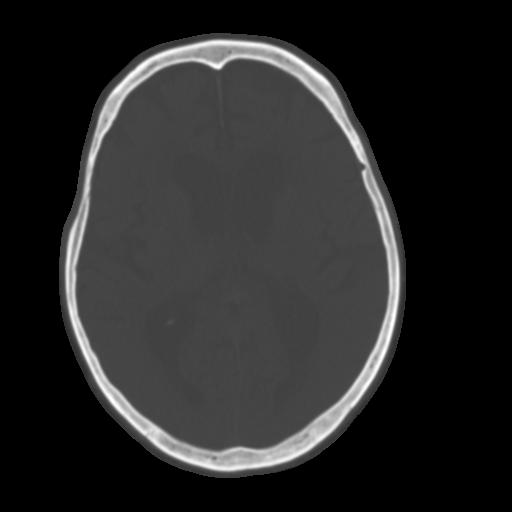
[im 20/33  brain]
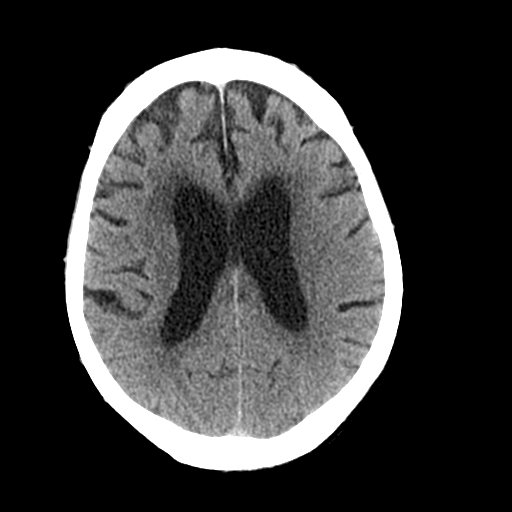
[im 24/33  brain]
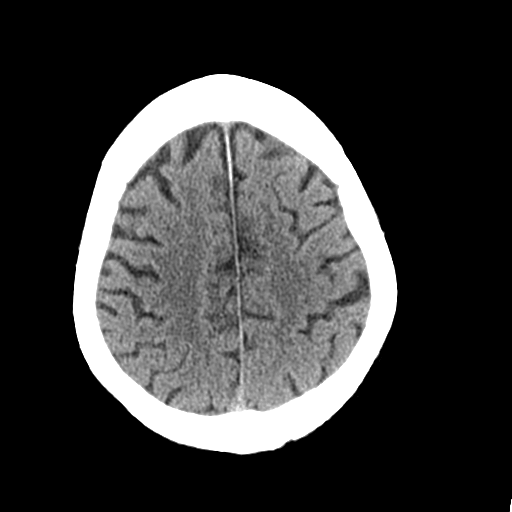
[im 27/33  brain]
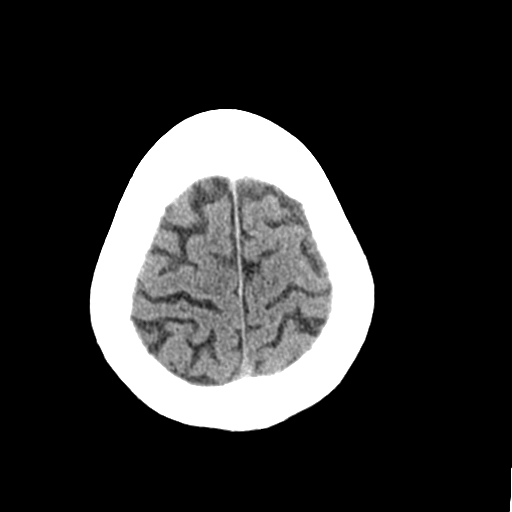
[im 30/33  brain]
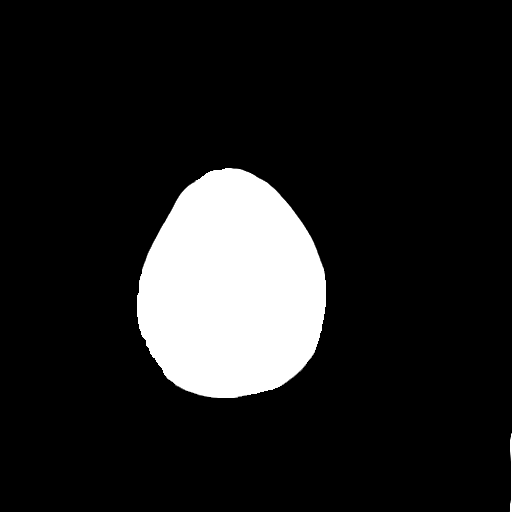
[im 30/33  bone]
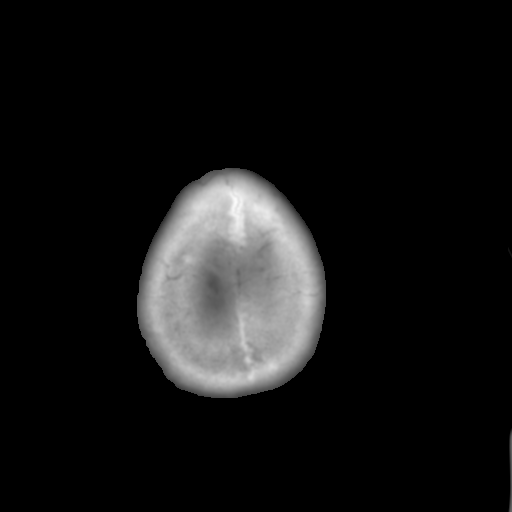

[Series 5: coronal soft · coronal · 0.33mm/px · 3 of 81 slices shown]
[im 27/81  brain]
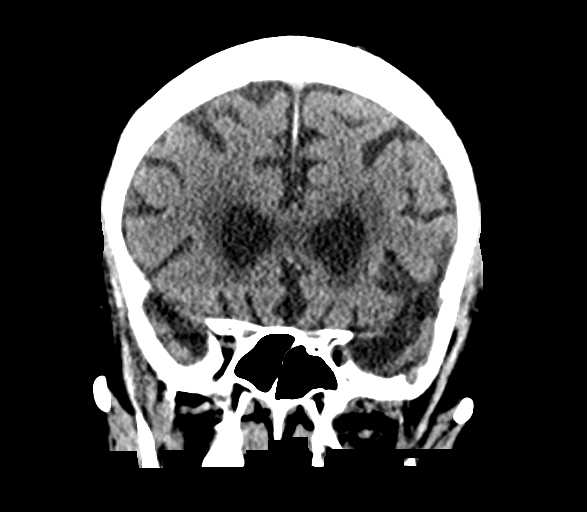
[im 36/81  brain]
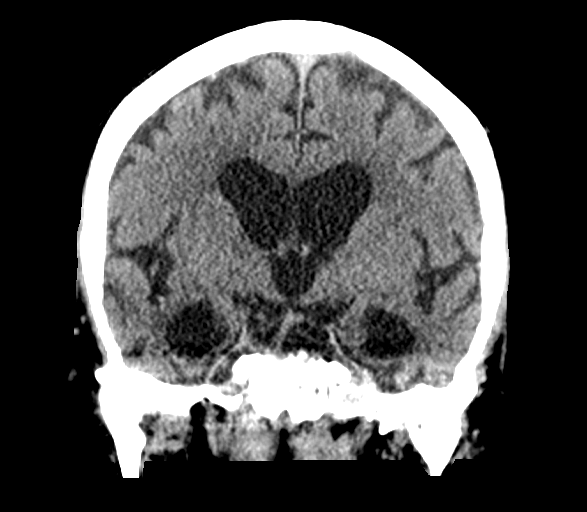
[im 45/81  brain]
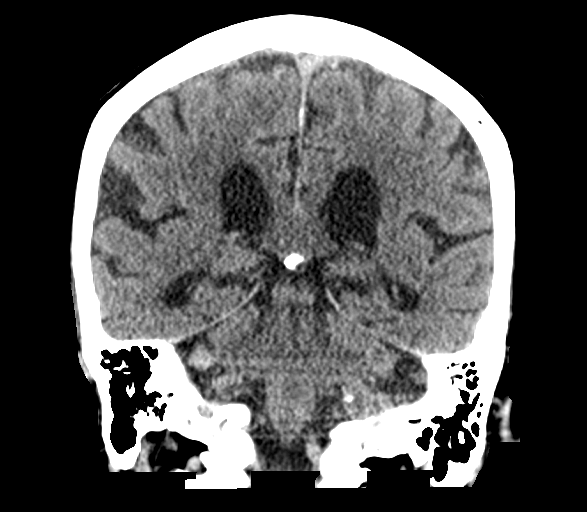

[Series 6: sagittal soft · sagittal · 0.35mm/px · 3 of 57 slices shown]
[im 19/57  brain]
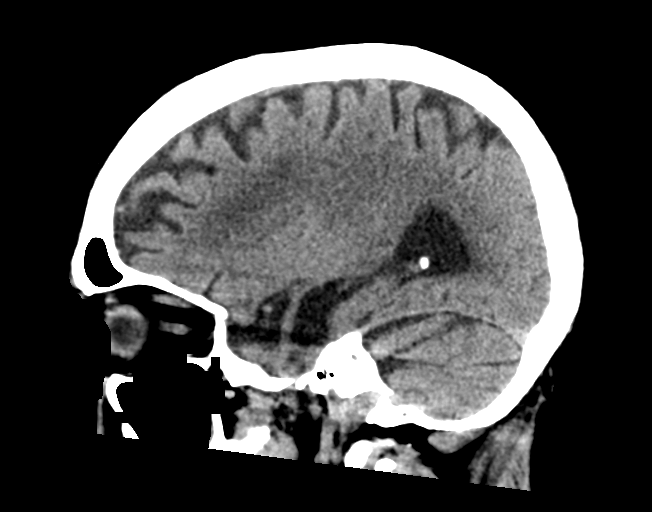
[im 29/57  brain]
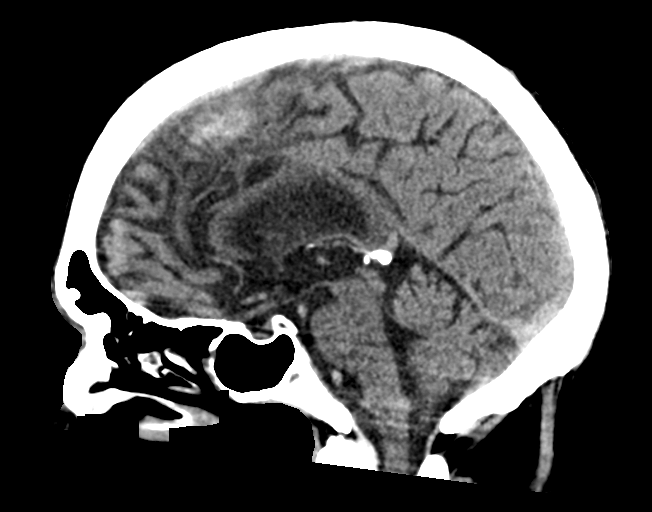
[im 38/57  brain]
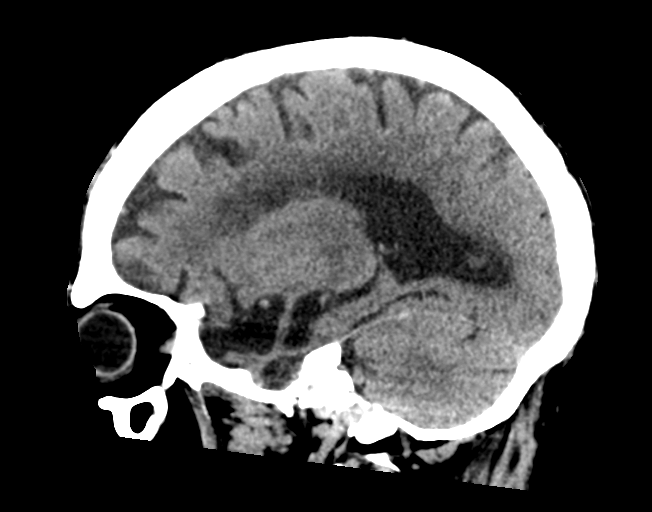

[15 of 47 positions shown; findings below may reference images not displayed]

FINDINGS: Brain: Cerebral volume appears stable since last year with chronic
disproportionate atrophy of the bilateral anterior and mesial
temporal lobes. Ex vacuo ventricular enlargement suspected.

Patchy and confluent bilateral white matter hypodensity is stable.

No midline shift, ventriculomegaly, mass effect, evidence of mass
lesion, intracranial hemorrhage or evidence of cortically based
acute infarction.

Vascular: Calcified atherosclerosis at the skull base. No suspicious
intracranial vascular hyperdensity.

Skull: Stable, intact.

Sinuses/Orbits: Mild to moderate new bilateral paranasal sinus
mucosal thickening, sparing the frontal and left sphenoid sinuses.
No sinus fluid level. Tympanic cavities and mastoids remain clear.

Other: No orbit or scalp soft tissue injury.
IMPRESSION: 1. No acute intracranial abnormality or acute traumatic injury
identified.
2. Stable chronic disproportionate atrophy of the bilateral temporal
lobes, white matter disease.
3. New mild to moderate bilateral paranasal sinus inflammation.

## 2021-10-01 IMAGING — CT CT CERVICAL SPINE W/O CM
3 of 4 series · 13 of 33 positions shown, 16 images · non-contrast
Comparison: Head CT today reported separately.

CLINICAL DATA: 79-year-old male status post fall at [HOSPITAL]
this morning. Struck back of head.

EXAM:
CT CERVICAL SPINE WITHOUT CONTRAST
TECHNIQUE: Multidetector CT imaging of the cervical spine was performed without
intravenous contrast. Multiplanar CT image reconstructions were also
generated.

[Series 9: sagittal bone · sagittal · 0.35mm/px · 5 of 61 slices shown, 6 images]
[im 21/61  bone]
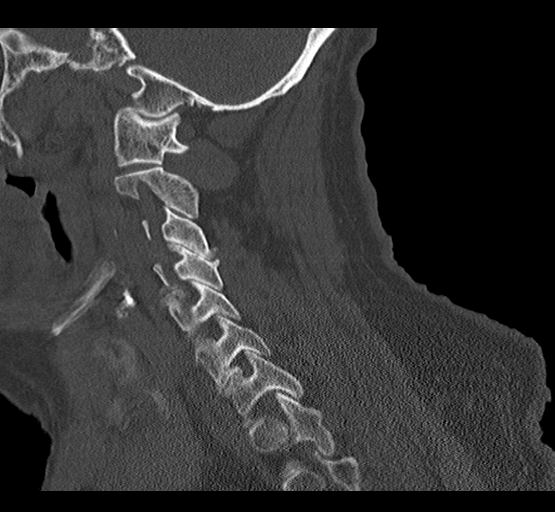
[im 26/61  bone]
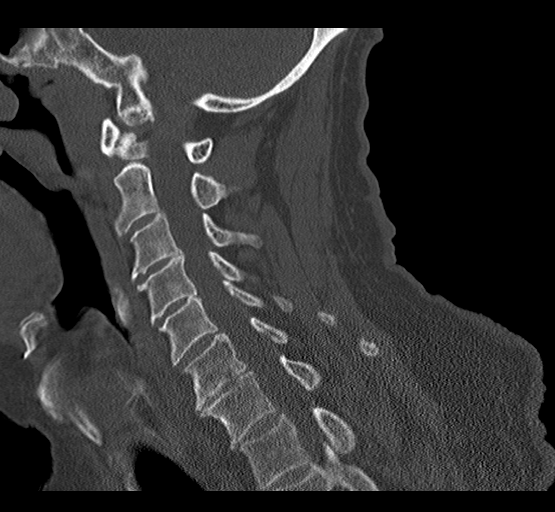
[im 31/61  soft-tissue]
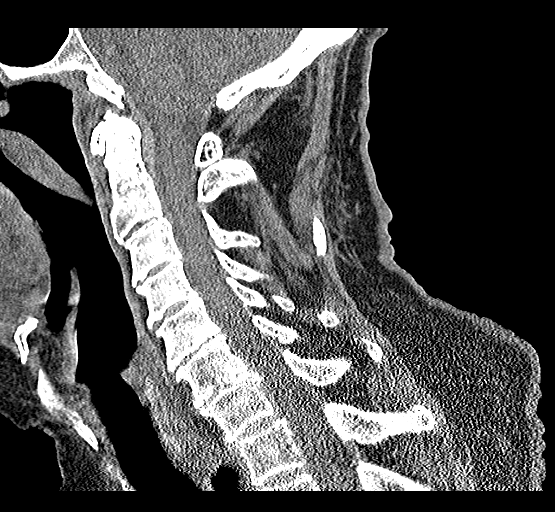
[im 31/61  bone]
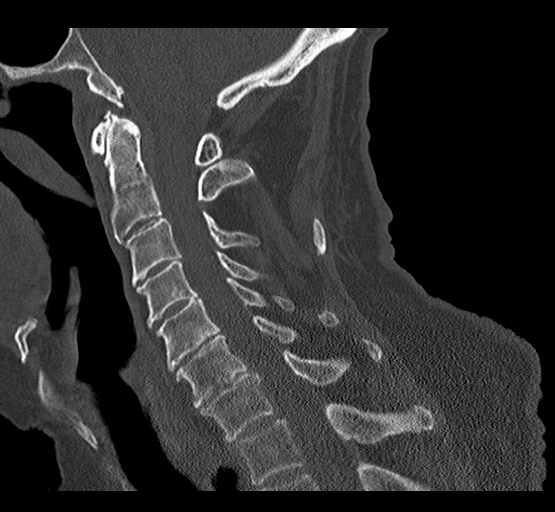
[im 36/61  bone]
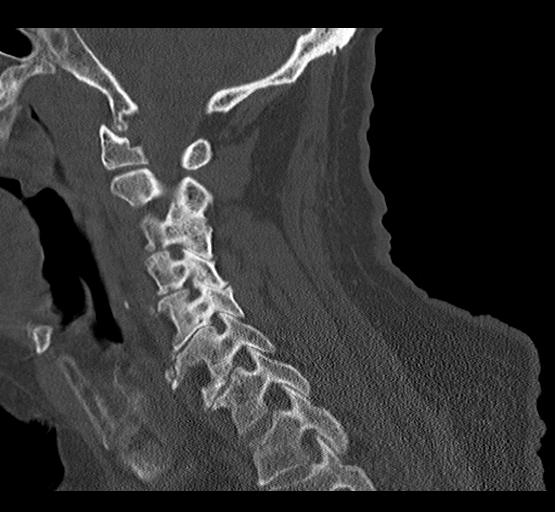
[im 41/61  bone]
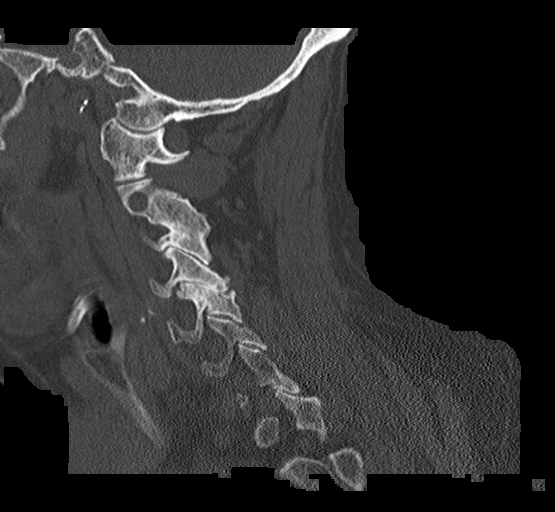

[Series 10: coronal bone · coronal · 0.23mm/px · 3 of 61 slices shown]
[im 15/61  bone]
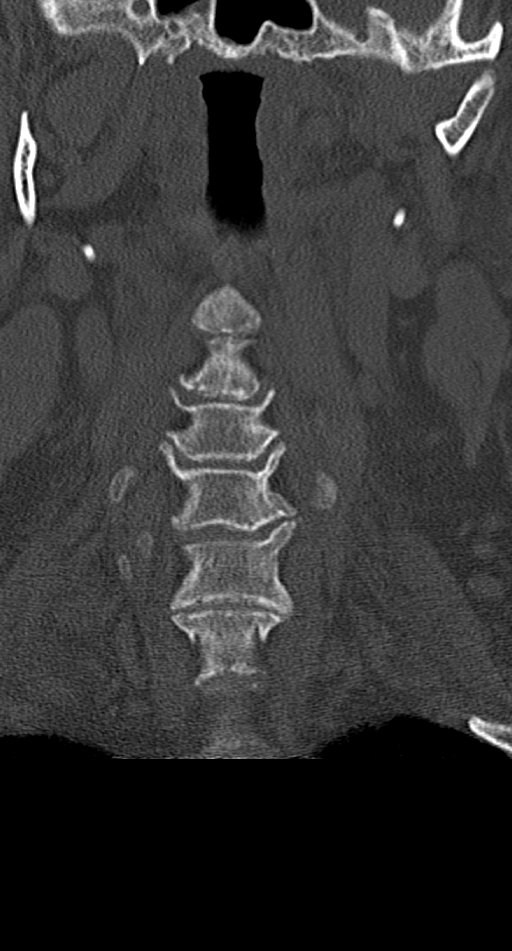
[im 25/61  bone]
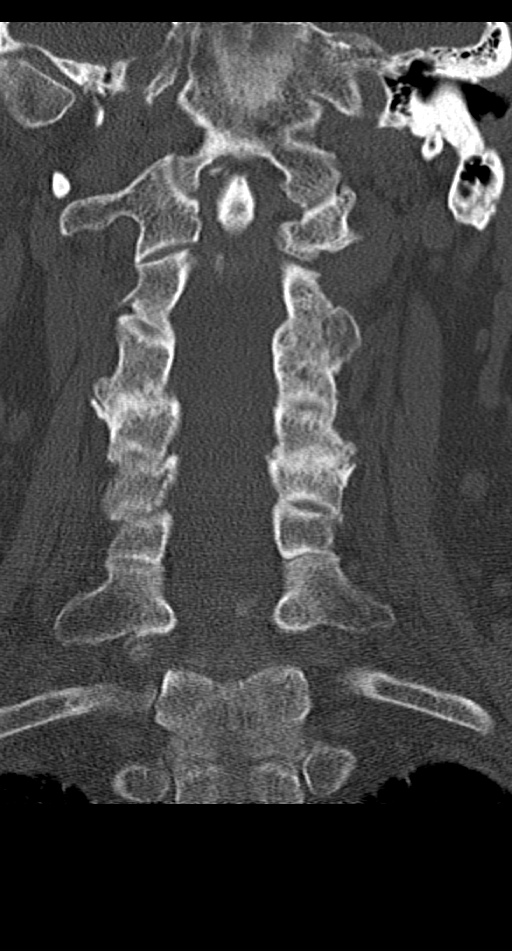
[im 36/61  bone]
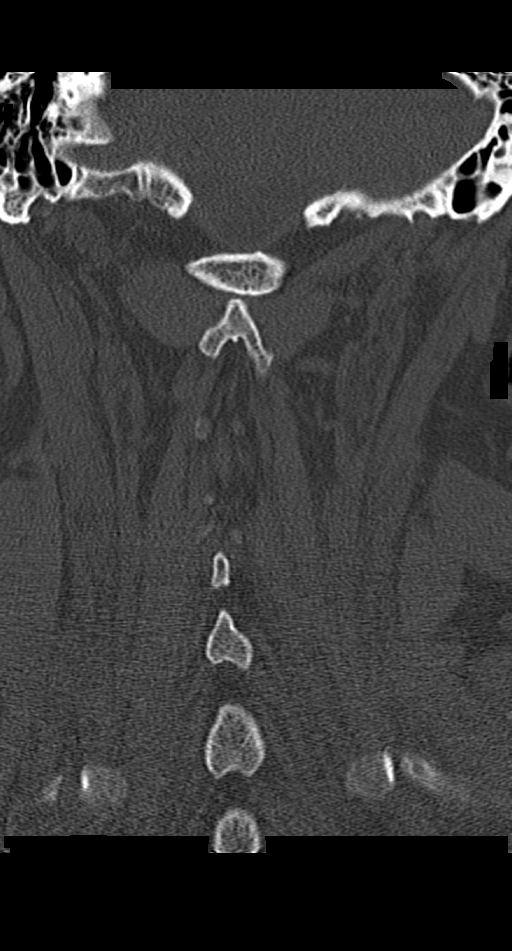

[Series 11: orthogonal axials · axial · 0.21mm/px · z∈[+121,+218]mm · 5 of 81 slices shown, 7 images]
[im 14/81  soft-tissue]
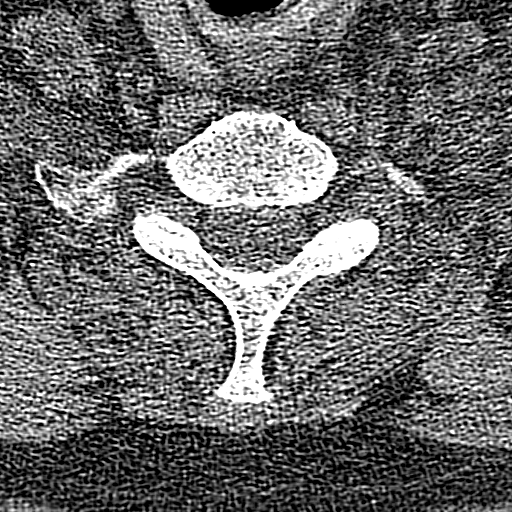
[im 14/81  bone]
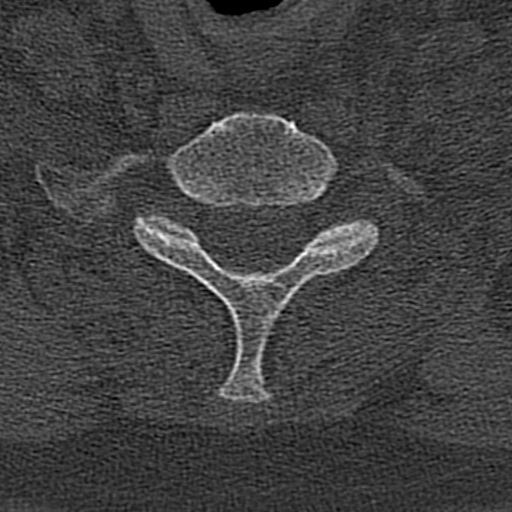
[im 27/81  bone]
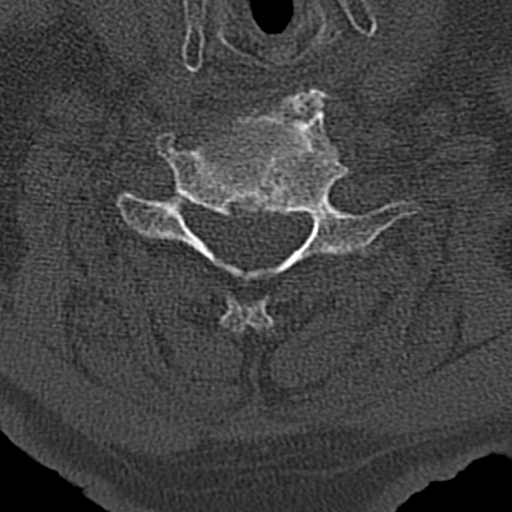
[im 41/81  bone]
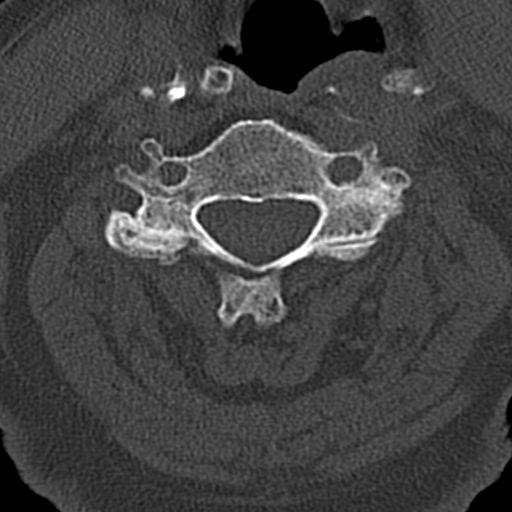
[im 54/81  bone]
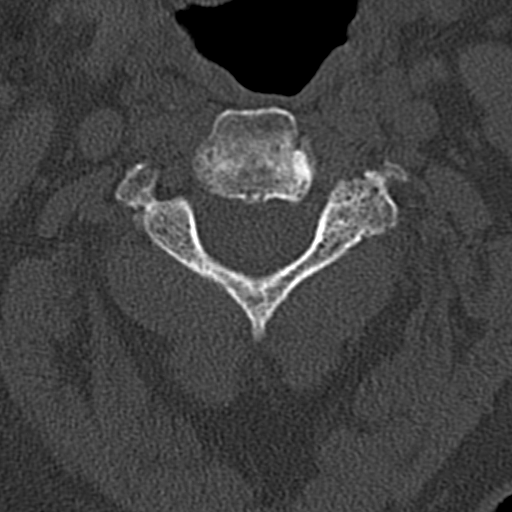
[im 67/81  soft-tissue]
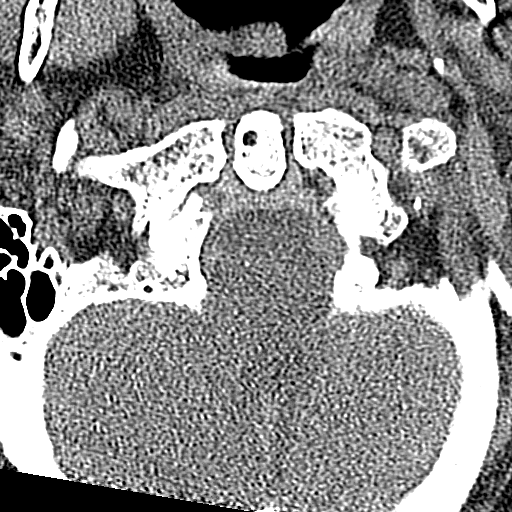
[im 67/81  bone]
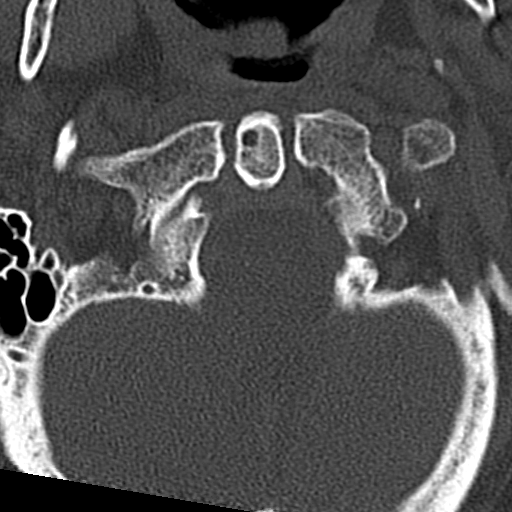

[13 of 33 positions shown; findings below may reference images not displayed]

FINDINGS: Alignment: Relatively preserved cervical lordosis. Cervicothoracic
junction alignment within normal limits. Bilateral posterior element
alignment is within normal limits.

Skull base and vertebrae: Visualized skull base is intact. No
atlanto-occipital dissociation. C1 and C2 normally aligned and
intact. No acute osseous abnormality identified.

Soft tissues and spinal canal: No prevertebral fluid or swelling. No
visible canal hematoma. Retropharyngeal course of the carotids with
calcified atherosclerosis. Negative visible noncontrast neck soft
tissues.

Disc levels: Degenerative subchondral cyst of the odontoid. Chronic
ankylosis left C2-C3 facets which are hypertrophied. Moderate
additional facet arthropathy at C3-C4, C4-C5. Widespread chronic
disc space loss.

Upper chest: Visible upper thoracic levels appear intact. Mild
apical lung scarring.

Other: None.
IMPRESSION: 1. No acute traumatic injury identified in the cervical spine.
2. Chronic cervical spine degeneration including ankylosis of the
left C2-C3 facets.
